# Patient Record
Sex: Male | Born: 1980 | Race: White | Hispanic: No | Marital: Married | Smoking: Former smoker
Health system: Southern US, Community
[De-identification: ages and names within clinical notes are randomized; demographics above are authoritative.]

## PROBLEM LIST (undated history)

## (undated) DIAGNOSIS — F419 Anxiety disorder, unspecified: Secondary | ICD-10-CM

## (undated) DIAGNOSIS — K219 Gastro-esophageal reflux disease without esophagitis: Secondary | ICD-10-CM

## (undated) DIAGNOSIS — I10 Essential (primary) hypertension: Secondary | ICD-10-CM

## (undated) DIAGNOSIS — F32A Depression, unspecified: Secondary | ICD-10-CM

## (undated) HISTORY — DX: Essential (primary) hypertension: I10

## (undated) HISTORY — DX: Gastro-esophageal reflux disease without esophagitis: K21.9

## (undated) HISTORY — DX: Depression, unspecified: F32.A

## (undated) HISTORY — DX: Anxiety disorder, unspecified: F41.9

---

## 2002-06-18 HISTORY — PX: UPPER GI ENDOSCOPY: SHX6162

## 2009-04-27 ENCOUNTER — Ambulatory Visit: Payer: Self-pay

## 2010-07-13 ENCOUNTER — Ambulatory Visit: Payer: Self-pay

## 2010-08-02 ENCOUNTER — Ambulatory Visit: Payer: Self-pay

## 2011-08-26 IMAGING — NM NM THYROID IMAGING W/ UPTAKE SINGLE (24 HR)
1 series · 3 of 3 positions shown · non-contrast
Comparison: none

REASON FOR EXAM: hyperthyroidism
COMMENTS:

[Series 1000: (id) thyroid scan · 2.40mm/px · 3 of 3 slices shown]
[im 1/3]
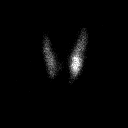
[im 2/3]
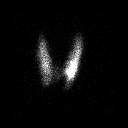
[im 3/3  full-range]
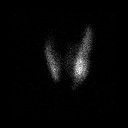

[3 of 3 positions shown; findings below may reference images not displayed]

PROCEDURE:     KNM - KNM THYROID 7-F94 24HR [DATE]  [DATE]

RESULT:     The patient received a dose of 159.6 uCi of 5odine-MCZ with
uptake measured at 6 and 24 hours to be 47.7 and 56.6% respectively. Images
demonstrate what appears to be a dominant nodule in the lower pole of the
left thyroid lobe with suppression of uptake in the remaining portions of
the gland. No photopenic areas are seen.
IMPRESSION: Probable dominant functioning nodule in the lower pole the
left lobe of the thyroid with findings consistent with hyperthyroidism.

## 2012-05-12 ENCOUNTER — Ambulatory Visit (INDEPENDENT_AMBULATORY_CARE_PROVIDER_SITE_OTHER): Payer: BC Managed Care – PPO | Admitting: Family Medicine

## 2012-05-12 VITALS — BP 128/83 | HR 95 | Temp 98.0°F | Resp 17 | Ht 75.0 in | Wt 203.0 lb

## 2012-05-12 DIAGNOSIS — L509 Urticaria, unspecified: Secondary | ICD-10-CM

## 2012-05-12 DIAGNOSIS — E039 Hypothyroidism, unspecified: Secondary | ICD-10-CM

## 2012-05-12 DIAGNOSIS — R509 Fever, unspecified: Secondary | ICD-10-CM

## 2012-05-12 DIAGNOSIS — J029 Acute pharyngitis, unspecified: Secondary | ICD-10-CM

## 2012-05-12 LAB — POCT CBC
HCT, POC: 52.1 % (ref 43.5–53.7)
Hemoglobin: 16.5 g/dL (ref 14.1–18.1)
Lymph, poc: 1.2 (ref 0.6–3.4)
MCH, POC: 29.9 pg (ref 27–31.2)
MCHC: 31.7 g/dL — AB (ref 31.8–35.4)
WBC: 7.1 10*3/uL (ref 4.6–10.2)

## 2012-05-12 LAB — POCT RAPID STREP A (OFFICE): Rapid Strep A Screen: NEGATIVE

## 2012-05-12 MED ORDER — DIPHENHYDRAMINE HCL 25 MG PO CAPS: 50.0000 mg | ORAL_CAPSULE | Freq: Once | ORAL | Status: DC

## 2012-05-12 NOTE — Progress Notes (Signed)
[facility] [address] [place] [address] [phone]  Date:  05/12/2012   Name:  Sean Dean   DOB:  1980-06-18   MRN:  [id number]  PCP:  No primary provider on file.    Chief Complaint: Fever, Chills, Sore Throat and Rash   History of Present Illness:  Sean Dean is a 31 y.o. very pleasant male patient who presents with the following:  About a week ago he noted onset of a few hives- however, these resolved after a day.  Yesterday around 10 pm the rash returned but was more widespread.  He has itchy hives over his extremities and trunk.  He also noted onset of chills last night and a temperature up to 100.7.  He has taken tylenol this morning. He also noted a ST yesterday- less painful now. It felt "like acid reflux."  He also has a mild cough. No runny or stuffy nose. No GI symptoms.    Generally healthy except for hypothyroidism.  His dosage was changed a couple of months ago- no other new medications or foods/ products.    He works as a Research officer, trade [employer]. Here today with his SO.  No known sick contacts but he does interact with a lot of people day to day.  He does not have any wounds, etc related to his work.    Never a smoker  There is no problem list on file for this patient.   Past Medical History  Diagnosis Date  . GERD (gastroesophageal reflux disease)     History reviewed. No pertinent past surgical history.  History  Substance Use Topics  . Smoking status: Never Smoker   . Smokeless tobacco: Not on file  . Alcohol Use: Not on file    History reviewed. No pertinent family history.  No Known Allergies  Medication list has been reviewed and updated.  Current Outpatient Prescriptions on File Prior to Visit  Medication Sig Dispense Refill  . levothyroxine (SYNTHROID, LEVOTHROID) 125 MCG tablet Take 125 mcg by mouth daily.        Review of Systems:  As per HPI- otherwise negative.   Physical Examination: Filed Vitals:   05/12/12 1141  BP: 128/83  Pulse: 95  Temp: 98 F (36.7 C)  Resp: 17   Filed Vitals:   05/12/12 1141  Height: 6\' 3"  (1.905 m)  Weight: 203 lb (92.08 kg)   Body mass index is 25.37 kg/(m^2). Ideal Body Weight: Weight in (lb) to have BMI = 25: 199.6   GEN: WDWN, NAD, Non-toxic, A & O x 3 HEENT: Atraumatic, Normocephalic. Neck supple. No masses, No LAD. Bilateral TM wnl, oropharynx normal.  PEERL,EOMI.   Ears and Nose: No external deformity. CV: RRR, No M/G/R. No JVD. No thrill. No extra heart sounds. PULM: CTA B, no wheezes, crackles, rhonchi. No retractions. No resp. distress. No accessory muscle use. ABD: S, NT, ND. No rebound. No HSM. EXTR: No c/c/e NEURO Normal gait.  PSYCH: Normally interactive. Conversant. Not depressed or anxious appearing.  Calm demeanor.  He has blanching hives over his body- mostly on his lower back and arms.  No petechial rash, not consistent with scarlet fever.    Results for orders placed in visit on 05/12/12  POCT CBC      Component Value Range   WBC 7.1  4.6 - 10.2 K/uL   Lymph, poc 1.2  0.6 - 3.4   POC LYMPH PERCENT 16.4  10 - 50 %L  MID (cbc) 0.6  0 - 0.9   POC MID % 8.2  0 - 12 %M   POC Granulocyte 5.4  2 - 6.9   Granulocyte percent 75.4  37 - 80 %G   RBC 5.51  4.69 - 6.13 M/uL   Hemoglobin 16.5  14.1 - 18.1 g/dL   HCT, POC 96.0  45.4 - 53.7 %   MCV 94.5  80 - 97 fL   MCH, POC 29.9  27 - 31.2 pg   MCHC 31.7 (*) 31.8 - 35.4 g/dL   RDW, POC 09.8     Platelet Count, POC 185  142 - 424 K/uL   MPV 11.2  0 - 99.8 fL  POCT INFLUENZA A/B      Component Value Range   Influenza A, POC Negative     Influenza B, POC Negative    POCT RAPID STREP A (OFFICE)      Component Value Range   Rapid Strep A Screen Negative  Negative   Given benadryl po 50 mg- this did help relieve his hives and itching Assessment and Plan: 1. Fever  POCT CBC, POCT Influenza A/B, POCT rapid strep A, Culture, Group A Strep  2. Hypothyroidism    3. Sore throat  POCT  rapid strep A, POCT rapid strep A, Culture, Group A Strep  4. Hives  diphenhydrAMINE (BENADRYL) capsule 50 mg   Sean Dean is here with a likely viral illness today.  He has non- specific symptoms of fever, chills, and a rash consistent with hives.  At this time will treat supportively with anti- pyretics and benadryl as needed for itching.  However, will follow- up closely if not getting better or if getting worse. If any severe symptoms such as AMS, severe HA, vomiting or high fever get help right away  [facility], MD

## 2012-05-12 NOTE — Patient Instructions (Addendum)
It seems that you have a viral illness.  Rest, drink plenty of fluids and use ibuprofen/ tylenol as needed.  If you are not better in the next 48 hours please let me know- Sooner if worse.

## 2012-05-14 LAB — CULTURE, GROUP A STREP: Organism ID, Bacteria: NORMAL

## 2012-05-29 ENCOUNTER — Other Ambulatory Visit: Payer: Self-pay | Admitting: Family Medicine

## 2012-05-29 LAB — CBC WITH DIFFERENTIAL/PLATELET
Eosinophil #: 0.1 10*3/uL (ref 0.0–0.7)
Eosinophil %: 1.7 %
HCT: 42.1 % (ref 40.0–52.0)
HGB: 14.4 g/dL (ref 13.0–18.0)
Lymphocyte #: 1.4 10*3/uL (ref 1.0–3.6)
Lymphocyte %: 19.6 %
MCH: 30.4 pg (ref 26.0–34.0)
MCV: 89 fL (ref 80–100)
Monocyte %: 5.7 %
Neutrophil #: 5 10*3/uL (ref 1.4–6.5)
Neutrophil %: 72.5 %

## 2012-05-29 LAB — COMPREHENSIVE METABOLIC PANEL
Bilirubin,Total: 0.7 mg/dL (ref 0.2–1.0)
Chloride: 106 mmol/L (ref 98–107)
Co2: 28 mmol/L (ref 21–32)
Creatinine: 1.09 mg/dL (ref 0.60–1.30)
EGFR (African American): >60
Glucose: 112 mg/dL — ABNORMAL HIGH (ref 65–99)
Potassium: 3.7 mmol/L (ref 3.5–5.1)
Total Protein: 7.9 g/dL (ref 6.4–8.2)

## 2012-10-01 DIAGNOSIS — L508 Other urticaria: Secondary | ICD-10-CM | POA: Insufficient documentation

## 2013-08-08 LAB — CBC AND DIFFERENTIAL
HEMATOCRIT: 43 % (ref 41–53)
HEMOGLOBIN: 14.7 g/dL (ref 13.5–17.5)
Neutrophils Absolute: 3 /uL
PLATELETS: 230 10*3/uL (ref 150–399)
WBC: 5.3 10^3/mL

## 2013-08-08 LAB — LIPID PANEL
Cholesterol: 182 mg/dL (ref 0–200)
HDL: 62 mg/dL (ref 35–70)
LDL Cholesterol: 105 mg/dL
TRIGLYCERIDES: 75 mg/dL (ref 40–160)

## 2013-08-08 LAB — BASIC METABOLIC PANEL
BUN: 13 mg/dL (ref 4–21)
Creatinine: 1 mg/dL (ref 0.6–1.3)
GLUCOSE: 70 mg/dL
POTASSIUM: 4.6 mmol/L (ref 3.4–5.3)
SODIUM: 142 mmol/L (ref 137–147)

## 2013-08-08 LAB — HEPATIC FUNCTION PANEL
ALT: 11 U/L (ref 10–40)
AST: 14 U/L (ref 14–40)
Alkaline Phosphatase: 64 U/L (ref 25–125)
BILIRUBIN, TOTAL: 0.8 mg/dL

## 2013-08-08 LAB — TSH: TSH: 1.31 u[IU]/mL (ref 0.41–5.90)

## 2014-12-09 DIAGNOSIS — E785 Hyperlipidemia, unspecified: Secondary | ICD-10-CM

## 2014-12-09 DIAGNOSIS — H53009 Unspecified amblyopia, unspecified eye: Secondary | ICD-10-CM | POA: Insufficient documentation

## 2014-12-09 DIAGNOSIS — D808 Other immunodeficiencies with predominantly antibody defects: Secondary | ICD-10-CM

## 2014-12-09 DIAGNOSIS — K219 Gastro-esophageal reflux disease without esophagitis: Secondary | ICD-10-CM | POA: Insufficient documentation

## 2014-12-09 DIAGNOSIS — F429 Obsessive-compulsive disorder, unspecified: Secondary | ICD-10-CM

## 2014-12-09 DIAGNOSIS — J309 Allergic rhinitis, unspecified: Secondary | ICD-10-CM | POA: Insufficient documentation

## 2014-12-09 DIAGNOSIS — K279 Peptic ulcer, site unspecified, unspecified as acute or chronic, without hemorrhage or perforation: Secondary | ICD-10-CM

## 2014-12-09 DIAGNOSIS — E559 Vitamin D deficiency, unspecified: Secondary | ICD-10-CM | POA: Insufficient documentation

## 2014-12-10 ENCOUNTER — Ambulatory Visit: Payer: Self-pay | Admitting: Family Medicine

## 2014-12-11 ENCOUNTER — Ambulatory Visit (INDEPENDENT_AMBULATORY_CARE_PROVIDER_SITE_OTHER): Payer: BLUE CROSS/BLUE SHIELD | Admitting: Family Medicine

## 2014-12-11 ENCOUNTER — Encounter: Payer: Self-pay | Admitting: Family Medicine

## 2014-12-11 VITALS — BP 114/72 | HR 64 | Temp 98.6°F | Resp 16 | Wt 227.0 lb

## 2014-12-11 DIAGNOSIS — E039 Hypothyroidism, unspecified: Secondary | ICD-10-CM | POA: Diagnosis not present

## 2014-12-11 DIAGNOSIS — K648 Other hemorrhoids: Secondary | ICD-10-CM

## 2014-12-11 DIAGNOSIS — T783XXA Angioneurotic edema, initial encounter: Secondary | ICD-10-CM | POA: Insufficient documentation

## 2014-12-11 DIAGNOSIS — M7052 Other bursitis of knee, left knee: Secondary | ICD-10-CM

## 2014-12-11 DIAGNOSIS — E7800 Pure hypercholesterolemia, unspecified: Secondary | ICD-10-CM | POA: Insufficient documentation

## 2014-12-11 DIAGNOSIS — E559 Vitamin D deficiency, unspecified: Secondary | ICD-10-CM | POA: Insufficient documentation

## 2014-12-11 MED ORDER — HYDROCORTISONE ACETATE 25 MG RE SUPP: 25.0000 mg | Freq: Every day | RECTAL | Status: DC

## 2014-12-11 MED ORDER — NAPROXEN 500 MG PO TABS: 500.0000 mg | ORAL_TABLET | Freq: Two times a day (BID) | ORAL | Status: DC

## 2014-12-11 NOTE — Progress Notes (Signed)
Patient ID: Sean Dean, male   DOB: December 24, 1980, 34 y.o.   MRN: [id number]    Subjective:  HPI Pt is here for a follow up of Hypothyroidism. Pt reports that he keeps gaining weight and exercises every day. He would like to have his levels checked to see if his meds need to be adjusted.   He also reports that he is having left leg pain. Occurs all the time. Worse at night and in the mornings. He reports that it has been going on for "quite a while". He reports it is a aching pain. He does sometimes have lower back pain and doe snot know if they could be related.   Prior to Admission medications   Medication Sig Start Date End Date Taking? Authorizing Provider  levothyroxine (SYNTHROID, LEVOTHROID) 125 MCG tablet Take 125 mcg by mouth daily.   Yes Historical Provider, MD    Patient Active Problem List   Diagnosis Date Noted  . Angio-edema 12/11/2014  . Adult hypothyroidism 12/11/2014  . Hypercholesterolemia without hypertriglyceridemia 12/11/2014  . Avitaminosis D 12/11/2014  . OCD (obsessive compulsive disorder) 12/09/2014  . Vitamin D deficiency 12/09/2014  . Hyperlipidemia 12/09/2014  . Lazy eye 12/09/2014  . Allergic rhinitis 12/09/2014  . GERD (gastroesophageal reflux disease) 12/09/2014  . PUD (peptic ulcer disease) 12/09/2014  . 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 Hypothyroidism 05/12/2012    Past Medical History  Diagnosis Date  . GERD (gastroesophageal reflux disease)     History   Social History  . Marital Status: Married    Spouse Name: N/A  . Number of Children: N/A  . Years of Education: N/A   Occupational History  . Not on file.   Social History Main Topics  . Smoking status: Former Games developer  . Smokeless tobacco: Not on file  . Alcohol Use: 12.6 oz/week    21 Cans of beer per week  . Drug Use: No  . Sexual Activity: Yes    Birth Control/ Protection: None   Other Topics Concern  . Not on file   Social History  Narrative    No Known Allergies  Review of Systems  Constitutional: Negative.   HENT: Negative.   Eyes: Negative.   Respiratory: Negative.   Cardiovascular: Negative.   Gastrointestinal: Negative.   Genitourinary: Negative.   Musculoskeletal: Positive for back pain and joint pain.  Skin: Negative.   Neurological: Negative.   Endo/Heme/Allergies: Negative.   Psychiatric/Behavioral: Negative.    visual exam of the perianal region reveals an internal hemorrhoid that is nonthrombosed and not actively bleeding.  Immunization History  Administered Date(s) Administered  . Tdap 03/31/2005   Objective:  BP 114/72 mmHg  Pulse 64  Temp(Src) 98.6 F (37 C) (Oral)  Resp 16  Wt 227 lb (102.967 kg)  Physical Exam  Constitutional: He is oriented to person, place, and time and well-developed, well-nourished, and in no distress.  HENT:  Head: Normocephalic and atraumatic.  Right Ear: External ear normal.  Left Ear: External ear normal.  Nose: Nose normal.  Eyes: Conjunctivae are normal.  Neck: Neck supple.  Cardiovascular: Normal rate, regular rhythm and normal heart sounds.   Pulmonary/Chest: Effort normal and breath sounds normal.  Abdominal: Soft. Bowel sounds are normal.  Visual exam of the perianal region reveals a nonbleeding, nonthrombosed internal hemorrhoid.  Musculoskeletal: Normal range of motion. He exhibits no edema or tenderness.  Minimal  tenderness along the medial joint line of the left knee. No effusion  or swelling or warmth.  Neurological: He is alert and oriented to person, place, and time. Gait normal.  Skin: Skin is warm and dry.  Psychiatric: Mood, memory, affect and judgment normal.    Lab Results  Component Value Date   WBC 5.3 08/08/2013   HGB 14.7 08/08/2013   HCT 43 08/08/2013   PLT 230 08/08/2013   GLUCOSE 112* 05/29/2012   CHOL 182 08/08/2013   TRIG 75 08/08/2013   HDL 62 08/08/2013   LDLCALC 105 08/08/2013   TSH 1.31 08/08/2013    CMP      Component Value Date/Time   NA 142 08/08/2013   NA 142 05/29/2012 1653   K 4.6 08/08/2013   K 3.7 05/29/2012 1653   CL 106 05/29/2012 1653   CO2 28 05/29/2012 1653   GLUCOSE 112* 05/29/2012 1653   BUN 13 08/08/2013   BUN 20* 05/29/2012 1653   CREATININE 1.0 08/08/2013   CREATININE 1.09 05/29/2012 1653   CALCIUM 9.1 05/29/2012 1653   PROT 7.9 05/29/2012 1653   ALBUMIN 4.6 05/29/2012 1653   AST 14 08/08/2013   AST 21 05/29/2012 1653   ALT 11 08/08/2013   ALT 29 05/29/2012 1653   ALKPHOS 64 08/08/2013   ALKPHOS 85 05/29/2012 1653   BILITOT 0.7 05/29/2012 1653   GFRNONAA >60 05/29/2012 1653   GFRAA >60 05/29/2012 1653    Assessment and Plan :  Knee pain Humble bursitis versus meniscal injury. Treatment with naproxen 500 mg twice a day when necessary. May need or the referral. Internal hemorrhoid Treat with Anusol HC suppository for 12 nights. Refer as needed. He has noticed some blood on the toilet paper. Julieanne Manson MD [facility] 12/11/2014 4:17 PM

## 2015-01-22 ENCOUNTER — Other Ambulatory Visit: Payer: Self-pay | Admitting: Family Medicine

## 2015-01-22 DIAGNOSIS — E039 Hypothyroidism, unspecified: Secondary | ICD-10-CM

## 2015-04-09 ENCOUNTER — Ambulatory Visit (INDEPENDENT_AMBULATORY_CARE_PROVIDER_SITE_OTHER): Payer: BLUE CROSS/BLUE SHIELD | Admitting: Family Medicine

## 2015-04-09 ENCOUNTER — Encounter: Payer: Self-pay | Admitting: Family Medicine

## 2015-04-09 VITALS — BP 132/74 | HR 60 | Temp 97.9°F | Resp 16 | Wt 222.0 lb

## 2015-04-09 DIAGNOSIS — N400 Enlarged prostate without lower urinary tract symptoms: Secondary | ICD-10-CM

## 2015-04-09 DIAGNOSIS — M7052 Other bursitis of knee, left knee: Secondary | ICD-10-CM

## 2015-04-09 DIAGNOSIS — E038 Other specified hypothyroidism: Secondary | ICD-10-CM | POA: Diagnosis not present

## 2015-04-09 DIAGNOSIS — M25562 Pain in left knee: Secondary | ICD-10-CM | POA: Diagnosis not present

## 2015-04-09 MED ORDER — TAMSULOSIN HCL 0.4 MG PO CAPS: 0.4000 mg | ORAL_CAPSULE | Freq: Every day | ORAL | Status: DC

## 2015-04-09 MED ORDER — NAPROXEN 500 MG PO TABS: 500.0000 mg | ORAL_TABLET | Freq: Two times a day (BID) | ORAL | Status: DC

## 2015-04-09 NOTE — Progress Notes (Signed)
Patient ID: Sean Dean, male   DOB: [birth year], 34 y.o.   MRN: [id number]    Subjective:  HPI Pt has multiple mild issues/concerns today. Nocturia: Patient states for 1 month he had nocturia-about 4 times a night he uses the bathroom. Sometimes he has little output but he has urgency to use the bathroom. Not really bothering him during the day, denies any other urinary symptoms. He does snore if he lays on his back, does not stop breathing that he knows of.  Prior to Admission medications   Medication Sig Start Date End Date Taking? Authorizing Provider  hydrocortisone (ANUSOL-HC) 25 MG suppository Place 1 suppository (25 mg total) rectally at bedtime. PRN 12/11/14  Yes Richard Hulen Shouts., MD  levothyroxine (SYNTHROID, LEVOTHROID) 125 MCG tablet TAKE 1 TABLET BY MOUTH EVERY DAY 01/22/15  Yes Maple Hudson., MD  naproxen (NAPROSYN) 500 MG tablet Take 1 tablet (500 mg total) by mouth 2 (two) times daily with a meal. 12/11/14  Yes Maple Hudson., MD    Patient Active Problem List   Diagnosis Date Noted  . Angio-edema 12/11/2014  . Adult hypothyroidism 12/11/2014  . Hypercholesterolemia without hypertriglyceridemia 12/11/2014  . Avitaminosis D 12/11/2014  . OCD (obsessive compulsive disorder) 12/09/2014  . Vitamin D deficiency 12/09/2014  . Hyperlipidemia 12/09/2014  . Lazy eye 12/09/2014  . Allergic rhinitis 12/09/2014  . GERD (gastroesophageal reflux disease) 12/09/2014  . PUD (peptic ulcer disease) 12/09/2014  . 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 Hypothyroidism 05/12/2012    Past Medical History  Diagnosis Date  . GERD (gastroesophageal reflux disease)     Social History   Social History  . Marital Status: Married    Spouse Name: N/A  . Number of Children: N/A  . Years of Education: N/A   Occupational History  . Not on file.   Social History Main Topics  . Smoking status: Former Games developer  . Smokeless tobacco: Never  Used  . Alcohol Use: 12.6 oz/week    21 Cans of beer per week  . Drug Use: No  . Sexual Activity: Yes    Birth Control/ Protection: None   Other Topics Concern  . Not on file   Social History Narrative    No Known Allergies  Review of Systems  Constitutional: Negative.   Respiratory: Negative.   Cardiovascular: Negative.   Gastrointestinal: Negative.   Genitourinary: Positive for urgency and frequency.  Neurological: Negative.   Endo/Heme/Allergies: Negative.   Psychiatric/Behavioral: Negative.     Immunization History  Administered Date(s) Administered  . Tdap 03/31/2005   Objective:  BP 132/74 mmHg  Pulse 60  Temp(Src) 97.9 F (36.6 C)  Resp 16  Wt 222 lb (100.699 kg)  Physical Exam  Constitutional: He is oriented to person, place, and time and well-developed, well-nourished, and in no distress.  HENT:  Head: Normocephalic and atraumatic.  Right Ear: External ear normal.  Left Ear: External ear normal.  Nose: Nose normal.  Eyes: Conjunctivae are normal.  Neck: Neck supple.  Cardiovascular: Normal rate, regular rhythm and normal heart sounds.   Pulmonary/Chest: Effort normal and breath sounds normal.  Abdominal: Soft.  Neurological: He is alert and oriented to person, place, and time.  Skin: Skin is warm and dry.  Psychiatric: Mood, memory, affect and judgment normal.    Lab Results  Component Value Date   WBC 5.3 08/08/2013   HGB 14.7 08/08/2013   HCT 43 08/08/2013   PLT  230 08/08/2013   GLUCOSE 112* 05/29/2012   CHOL 182 08/08/2013   TRIG 75 08/08/2013   HDL 62 08/08/2013   LDLCALC 105 08/08/2013   TSH 1.31 08/08/2013    CMP     Component Value Date/Time   NA 142 08/08/2013   NA 142 05/29/2012 1653   K 4.6 08/08/2013   K 3.7 05/29/2012 1653   CL 106 05/29/2012 1653   CO2 28 05/29/2012 1653   GLUCOSE 112* 05/29/2012 1653   BUN 13 08/08/2013   BUN 20* 05/29/2012 1653   CREATININE 1.0 08/08/2013   CREATININE 1.09 05/29/2012 1653    CALCIUM 9.1 05/29/2012 1653   PROT 7.9 05/29/2012 1653   ALBUMIN 4.6 05/29/2012 1653   AST 14 08/08/2013   AST 21 05/29/2012 1653   ALT 11 08/08/2013   ALT 29 05/29/2012 1653   ALKPHOS 64 08/08/2013   ALKPHOS 85 05/29/2012 1653   BILITOT 0.7 05/29/2012 1653   GFRNONAA >60 05/29/2012 1653   GFRAA >60 05/29/2012 1653    Assessment and Plan :  1. Left knee pain Possible bursitis. - Ambulatory referral to Orthopedic Surgery  2. BPH (benign prostatic hypertrophy)/OAB symptoms New. UA was normal today. Epworth was 16. Will follow and treat as BPH at this time - tamsulosin (FLOMAX) 0.4 MG CAPS capsule; Take 1 capsule (0.4 mg total) by mouth daily.  Dispense: 30 capsule; Refill: 5 - POCT urinalysis dipstick  3. Other specified hypothyroidism  - TSH  4. Bursitis, knee, left  - naproxen (NAPROSYN) 500 MG tablet; Take 1 tablet (500 mg total) by mouth 2 (two) times daily with a meal.  Dispense: 60 tablet; Refill: 5 5. Possible OSA Epworth 16. Pt declines sleep study.  Julieanne Mansonichard Gilbert MD [facility] 04/09/2015 11:23 AM

## 2015-04-10 LAB — TSH: TSH: 2.96 u[IU]/mL (ref 0.450–4.500)

## 2015-04-13 NOTE — Progress Notes (Signed)
Advised  ED 

## 2015-05-01 ENCOUNTER — Telehealth: Payer: Self-pay

## 2015-05-01 DIAGNOSIS — E039 Hypothyroidism, unspecified: Secondary | ICD-10-CM

## 2015-05-01 MED ORDER — LEVOTHYROXINE SODIUM 125 MCG PO TABS: 125.0000 ug | ORAL_TABLET | Freq: Every day | ORAL | Status: DC

## 2015-05-01 NOTE — Telephone Encounter (Signed)
Refill request from pharmacy[facility] 

## 2015-05-13 ENCOUNTER — Encounter: Payer: Self-pay | Admitting: Family Medicine

## 2015-05-13 ENCOUNTER — Ambulatory Visit (INDEPENDENT_AMBULATORY_CARE_PROVIDER_SITE_OTHER): Payer: BLUE CROSS/BLUE SHIELD | Admitting: Family Medicine

## 2015-05-13 VITALS — BP 128/72 | HR 64 | Temp 97.4°F | Resp 16 | Ht 76.0 in | Wt 222.0 lb

## 2015-05-13 DIAGNOSIS — E039 Hypothyroidism, unspecified: Secondary | ICD-10-CM

## 2015-05-13 DIAGNOSIS — Z0000 Encounter for general adult medical examination without abnormal findings: Secondary | ICD-10-CM | POA: Diagnosis not present

## 2015-05-13 DIAGNOSIS — E7800 Pure hypercholesterolemia, unspecified: Secondary | ICD-10-CM

## 2015-05-13 DIAGNOSIS — Z23 Encounter for immunization: Secondary | ICD-10-CM

## 2015-05-13 LAB — POCT URINALYSIS DIPSTICK
Bilirubin, UA: NEGATIVE
Glucose, UA: NEGATIVE
Ketones, UA: NEGATIVE
LEUKOCYTES UA: NEGATIVE
NITRITE UA: NEGATIVE
PH UA: 6
Protein, UA: NEGATIVE
RBC UA: NEGATIVE
Spec Grav, UA: 1.025
Urobilinogen, UA: 0.2

## 2015-05-13 NOTE — Progress Notes (Signed)
Patient ID: Sean Dean, male   DOB: 10/01/1980, 34 y.o.   MRN: [id number]       Patient: Sean Dean, Male    DOB: 10/01/1980, 34 y.o.   MRN: [id number] Visit Date: 05/13/2015  Today's Provider: Megan Mansichard Sanchez Hemmer Jr, MD   Chief Complaint  Patient presents with  . Annual Exam   Subjective:    Annual physical exam Sean Dean is a 34 y.o. male who presents today for health maintenance and complete physical. He feels well. He reports exercising 3 days a week. He reports he is sleeping well. He sleeps on average 6-8 hours.   Last:  Tdap- 03/31/2005  EKG- 06/10/2010  Endoscopy- 06/18/2002    -----------------------------------------------------------------   Review of Systems  Constitutional: Negative.   HENT: Negative.   Eyes: Negative.   Respiratory: Negative.   Cardiovascular: Negative.   Gastrointestinal: Negative.   Endocrine: Negative.   Genitourinary: Negative.   Musculoskeletal: Negative.   Skin: Negative.   Allergic/Immunologic: Negative.   Neurological: Negative.   Hematological: Negative.   Psychiatric/Behavioral: Negative.     Social History      He  reports that he has quit smoking. He has never used smokeless tobacco. He reports that he drinks about 12.6 oz of alcohol per week. He reports that he does not use illicit drugs.       Social History   Social History  . Marital Status: Married    Spouse Name: N/A  . Number of Children: N/A  . Years of Education: N/A   Social History Main Topics  . Smoking status: Former Games developermoker  . Smokeless tobacco: Never Used  . Alcohol Use: 12.6 oz/week    21 Cans of beer per week  . Drug Use: No  . Sexual Activity: Yes    Birth Control/ Protection: None   Other Topics Concern  . None   Social History Narrative    Patient Active Problem List   Diagnosis Date Noted  . Angio-edema 12/11/2014  . Adult hypothyroidism 12/11/2014  . Hypercholesterolemia without hypertriglyceridemia 12/11/2014  .  Avitaminosis D 12/11/2014  . OCD (obsessive compulsive disorder) 12/09/2014  . Vitamin D deficiency 12/09/2014  . Hyperlipidemia 12/09/2014  . Lazy eye 12/09/2014  . Allergic rhinitis 12/09/2014  . GERD (gastroesophageal reflux disease) 12/09/2014  . PUD (peptic ulcer disease) 12/09/2014  . 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 Hypothyroidism 05/12/2012    Past Surgical History  Procedure Laterality Date  . Upper gi endoscopy  06/18/02    normal everything    Family History        Family Status  Relation Status Death Age  . Sister Alive   . Mother Alive   . Father Alive   . Maternal Grandmother Deceased         His family history includes Colon cancer in his maternal grandfather; Epilepsy in his sister; Healthy in his father and mother; Prostate cancer in his paternal grandfather.    No Known Allergies  Previous Medications   LEVOTHYROXINE (SYNTHROID, LEVOTHROID) 125 MCG TABLET    Take 1 tablet (125 mcg total) by mouth daily.   NAPROXEN (NAPROSYN) 500 MG TABLET    Take 1 tablet (500 mg total) by mouth 2 (two) times daily with a meal.   TAMSULOSIN (FLOMAX) 0.4 MG CAPS CAPSULE    Take 1 capsule (0.4 mg total) by mouth daily.    Patient Care Team: Maple Hudsonichard L Eshan Trupiano Jr., MD as PCP -  General (Family Medicine)     Objective:   Vitals: BP 128/72 mmHg  Pulse 64  Temp(Src) 97.4 F (36.3 C)  Resp 16  Ht  (1.93 m)  Wt 222 lb (100.699 kg)  BMI 27.03 kg/m2   Physical Exam  Constitutional: He is oriented to person, place, and time. He appears well-developed and well-nourished.  HENT:  Head: Normocephalic.  Right Ear: External ear normal.  Left Ear: External ear normal.  Nose: Nose normal.  Mouth/Throat: Oropharynx is clear and moist.  Eyes: Conjunctivae and EOM are normal. Pupils are equal, round, and reactive to light.  Neck: Neck supple.  Cardiovascular: Normal rate, regular rhythm, normal heart sounds and intact distal pulses.     Pulmonary/Chest: Effort normal and breath sounds normal.  Abdominal: Soft.  Genitourinary: Penis normal.  Musculoskeletal: Normal range of motion.  Neurological: He is alert and oriented to person, place, and time.  Skin: Skin is warm and dry.  Psychiatric: He has a normal mood and affect. His behavior is normal. Judgment and thought content normal.     Assessment & Plan:     Routine Health Maintenance and Physical Exam  Exercise Activities and Dietary recommendations Goals    None      Immunization History  Administered Date(s) Administered  . Tdap 03/31/2005    Health Maintenance  Topic Date Due  . HIV Screening  12/13/1994  . TETANUS/TDAP  04/01/2015  . INFLUENZA VACCINE  04/08/2016 (Originally 12/22/2014)      Discussed health benefits of physical activity, and encouraged him to engage in regular exercise appropriate for his age and condition.   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 I have done the exam and reviewed the above chart and it is accurate to the best of my knowledge.  --------------------------------------------------------------------

## 2015-05-14 LAB — TSH: TSH: 3.39 u[IU]/mL (ref 0.450–4.500)

## 2015-05-14 LAB — COMPREHENSIVE METABOLIC PANEL
ALK PHOS: 59 IU/L (ref 39–117)
ALT: 16 IU/L (ref 0–44)
AST: 17 IU/L (ref 0–40)
Albumin/Globulin Ratio: 2 (ref 1.1–2.5)
Albumin: 4.5 g/dL (ref 3.5–5.5)
BUN/Creatinine Ratio: 19 (ref 8–19)
BUN: 16 mg/dL (ref 6–20)
Bilirubin Total: 0.6 mg/dL (ref 0.0–1.2)
CO2: 26 mmol/L (ref 18–29)
Calcium: 10.1 mg/dL (ref 8.7–10.2)
Chloride: 98 mmol/L (ref 96–106)
Creatinine, Ser: 0.86 mg/dL (ref 0.76–1.27)
GFR calc Af Amer: 130 mL/min/{1.73_m2} (ref >59)
GFR calc non Af Amer: 112 mL/min/{1.73_m2} (ref >59)
GLOBULIN, TOTAL: 2.3 g/dL (ref 1.5–4.5)
Glucose: 94 mg/dL (ref 65–99)
Potassium: 4.2 mmol/L (ref 3.5–5.2)
SODIUM: 139 mmol/L (ref 134–144)
Total Protein: 6.8 g/dL (ref 6.0–8.5)

## 2015-05-14 LAB — LIPID PANEL
CHOLESTEROL TOTAL: 203 mg/dL — AB (ref 100–199)
Chol/HDL Ratio: 2.6 ratio units (ref 0.0–5.0)
HDL: 77 mg/dL (ref >39)
LDL Calculated: 116 mg/dL — ABNORMAL HIGH (ref 0–99)
Triglycerides: 51 mg/dL (ref 0–149)
VLDL CHOLESTEROL CAL: 10 mg/dL (ref 5–40)

## 2015-05-14 LAB — CBC WITH DIFFERENTIAL/PLATELET
BASOS ABS: 0 10*3/uL (ref 0.0–0.2)
Basos: 0 %
EOS (ABSOLUTE): 0.1 10*3/uL (ref 0.0–0.4)
Eos: 2 %
Hematocrit: 42.4 % (ref 37.5–51.0)
Hemoglobin: 14.6 g/dL (ref 12.6–17.7)
Immature Grans (Abs): 0 10*3/uL (ref 0.0–0.1)
Immature Granulocytes: 0 %
LYMPHS ABS: 1.4 10*3/uL (ref 0.7–3.1)
Lymphs: 23 %
MCH: 31.2 pg (ref 26.6–33.0)
MCHC: 34.4 g/dL (ref 31.5–35.7)
MCV: 91 fL (ref 79–97)
Monocytes Absolute: 0.5 10*3/uL (ref 0.1–0.9)
Monocytes: 8 %
Neutrophils Absolute: 4.3 10*3/uL (ref 1.4–7.0)
Neutrophils: 67 %
Platelets: 203 10*3/uL (ref 150–379)
RBC: 4.68 x10E6/uL (ref 4.14–5.80)
RDW: 12.9 % (ref 12.3–15.4)
WBC: 6.3 10*3/uL (ref 3.4–10.8)

## 2015-11-05 ENCOUNTER — Encounter: Payer: Self-pay | Admitting: Family Medicine

## 2015-11-05 ENCOUNTER — Ambulatory Visit
Admission: RE | Admit: 2015-11-05 | Discharge: 2015-11-05 | Disposition: A | Discharge status: Home / Self Care | Payer: BLUE CROSS/BLUE SHIELD | Source: Ambulatory Visit | Attending: Family Medicine | Admitting: Family Medicine

## 2015-11-05 ENCOUNTER — Ambulatory Visit (INDEPENDENT_AMBULATORY_CARE_PROVIDER_SITE_OTHER): Payer: BLUE CROSS/BLUE SHIELD | Admitting: Family Medicine

## 2015-11-05 VITALS — BP 124/68 | HR 62 | Temp 97.4°F | Resp 16 | Wt 220.0 lb

## 2015-11-05 DIAGNOSIS — M79605 Pain in left leg: Secondary | ICD-10-CM

## 2015-11-05 DIAGNOSIS — M419 Scoliosis, unspecified: Secondary | ICD-10-CM | POA: Diagnosis not present

## 2015-11-05 DIAGNOSIS — K219 Gastro-esophageal reflux disease without esophagitis: Secondary | ICD-10-CM | POA: Diagnosis not present

## 2015-11-05 DIAGNOSIS — R1032 Left lower quadrant pain: Secondary | ICD-10-CM

## 2015-11-05 DIAGNOSIS — M545 Low back pain: Secondary | ICD-10-CM | POA: Diagnosis not present

## 2015-11-05 DIAGNOSIS — M25552 Pain in left hip: Secondary | ICD-10-CM | POA: Diagnosis not present

## 2015-11-05 MED ORDER — PREDNISONE 10 MG (48) PO TBPK: ORAL_TABLET | Freq: Every day | ORAL | Status: DC

## 2015-11-05 MED ORDER — OMEPRAZOLE 20 MG PO CPDR: 20.0000 mg | DELAYED_RELEASE_CAPSULE | Freq: Every day | ORAL | Status: DC

## 2015-11-05 NOTE — Progress Notes (Signed)
Patient ID: Sean Dean, male   DOB: 1980-07-30, 35 y.o.   MRN: [id number]    Subjective:  HPI Pt is here for left leg pain. From the groin down to the knee. He reports that it has been going on for a couple of years. He reports that the pain is like a dull achy pain. Pt reports that it is a constant pain, not made worse by anything and taking Naproxen and icy hot spray helps. The pain is worse at night when he lays in bed. He reports that he was seen for this a while back and was referred to PT but at the time he was having issues with insurance and could not go. No x-rays have been done in the past for this.   Prior to Admission medications   Medication Sig Start Date End Date Taking? Authorizing Provider  levothyroxine (SYNTHROID, LEVOTHROID) 125 MCG tablet Take 1 tablet (125 mcg total) by mouth daily. 05/01/15  Yes Richard Hulen ShoutsL Gilbert Jr., MD  naproxen (NAPROSYN) 500 MG tablet Take 1 tablet (500 mg total) by mouth 2 (two) times daily with a meal. 04/09/15  Yes Maple Hudsonichard L Gilbert Jr., MD  tamsulosin (FLOMAX) 0.4 MG CAPS capsule Take 1 capsule (0.4 mg total) by mouth daily. Patient not taking: Reported on 05/13/2015 04/09/15   Maple Hudsonichard L Gilbert Jr., MD    Patient Active Problem List   Diagnosis Date Noted  . Angio-edema 12/11/2014  . Adult hypothyroidism 12/11/2014  . Hypercholesterolemia without hypertriglyceridemia 12/11/2014  . Avitaminosis D 12/11/2014  . OCD (obsessive compulsive disorder) 12/09/2014  . Vitamin D deficiency 12/09/2014  . Hyperlipidemia 12/09/2014  . Lazy eye 12/09/2014  . Allergic rhinitis 12/09/2014  . GERD (gastroesophageal reflux disease) 12/09/2014  . PUD (peptic ulcer disease) 12/09/2014  . 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 Hypothyroidism 05/12/2012    Past Medical History  Diagnosis Date  . GERD (gastroesophageal reflux disease)     Social History   Social History  . Marital Status: Married    Spouse Name:  N/A  . Number of Children: N/A  . Years of Education: N/A   Occupational History  . Not on file.   Social History Main Topics  . Smoking status: Former Games developermoker  . Smokeless tobacco: Never Used  . Alcohol Use: 12.6 oz/week    21 Cans of beer per week     Comment: couple drinks every other day  . Drug Use: No  . Sexual Activity: Yes    Birth Control/ Protection: None   Other Topics Concern  . Not on file   Social History Narrative    No Known Allergies  Review of Systems  Constitutional: Negative.   HENT: Negative.   Eyes: Negative.   Respiratory: Negative.   Cardiovascular: Negative.   Gastrointestinal: Positive for heartburn.  Genitourinary: Negative.   Musculoskeletal: Negative.        Left leg pain from groin to knee  Skin: Negative.   Neurological: Negative.   Endo/Heme/Allergies: Negative.   Psychiatric/Behavioral: Negative.     Immunization History  Administered Date(s) Administered  . Influenza,inj,Quad PF,36+ Mos 05/13/2015  . Tdap 03/31/2005, 05/06/2011, 05/13/2015   Objective:  BP 124/68 mmHg  Pulse 62  Temp(Src) 97.4 F (36.3 C) (Oral)  Resp 16  Wt 220 lb (99.791 kg)  Physical Exam  Constitutional: He is well-developed, well-nourished, and in no distress.  Eyes: Conjunctivae and EOM are normal. Pupils are equal, round, and reactive to light.  Neck: Normal range of motion. Neck supple.  Cardiovascular: Normal rate, regular rhythm, normal heart sounds and intact distal pulses.   Pulmonary/Chest: Effort normal and breath sounds normal.  Abdominal: Soft. Bowel sounds are normal.  Musculoskeletal:  Positive straight leg raise on left leg. Figure 4 negative.   Skin: Skin is warm and dry.  Psychiatric: Mood, memory, affect and judgment normal.  Right 5th toe normal.  Lab Results  Component Value Date   WBC 6.3 05/13/2015   HGB 14.7 08/08/2013   HCT 42.4 05/13/2015   PLT 203 05/13/2015   GLUCOSE 94 05/13/2015   CHOL 203* 05/13/2015   TRIG 51  05/13/2015   HDL 77 05/13/2015   LDLCALC 116* 05/13/2015   TSH 3.390 05/13/2015    CMP     Component Value Date/Time   NA 139 05/13/2015 1203   NA 142 05/29/2012 1653   K 4.2 05/13/2015 1203   K 3.7 05/29/2012 1653   CL 98 05/13/2015 1203   CL 106 05/29/2012 1653   CO2 26 05/13/2015 1203   CO2 28 05/29/2012 1653   GLUCOSE 94 05/13/2015 1203   GLUCOSE 112* 05/29/2012 1653   BUN 16 05/13/2015 1203   BUN 20* 05/29/2012 1653   CREATININE 0.86 05/13/2015 1203   CREATININE 1.0 08/08/2013   CREATININE 1.09 05/29/2012 1653   CALCIUM 10.1 05/13/2015 1203   CALCIUM 9.1 05/29/2012 1653   PROT 6.8 05/13/2015 1203   PROT 7.9 05/29/2012 1653   ALBUMIN 4.5 05/13/2015 1203   ALBUMIN 4.6 05/29/2012 1653   AST 17 05/13/2015 1203   AST 21 05/29/2012 1653   ALT 16 05/13/2015 1203   ALT 29 05/29/2012 1653   ALKPHOS 59 05/13/2015 1203   ALKPHOS 85 05/29/2012 1653   BILITOT 0.6 05/13/2015 1203   BILITOT 0.7 05/29/2012 1653   GFRNONAA 112 05/13/2015 1203   GFRNONAA >60 05/29/2012 1653   GFRAA 130 05/13/2015 1203   GFRAA >60 05/29/2012 1653    Assessment and Plan :  1. Pain of left lower extremity Possible LS radiculopathy/spinal stenosis.May need MRI or referral. - DG Lumbar Spine Complete; Future - DG HIP UNILAT WITH PELVIS 2-3 VIEWS LEFT; Future - predniSONE (STERAPRED UNI-PAK 48 TAB) 10 MG (48) TBPK tablet; Take by mouth daily. Prednisone taper as directed.  Dispense: 48 tablet; Refill: 0  2. Groin pain, left  - DG Lumbar Spine Complete; Future - DG HIP UNILAT WITH PELVIS 2-3 VIEWS LEFT; Future - predniSONE (STERAPRED UNI-PAK 48 TAB) 10 MG (48) TBPK tablet; Take by mouth daily. Prednisone taper as directed.  Dispense: 48 tablet; Refill: 0  3. Gastroesophageal reflux disease, esophagitis presence not specified  - omeprazole (PRILOSEC) 20 MG capsule; Take 1 capsule (20 mg total) by mouth daily.  Dispense: 30 capsule; Refill: 12  Follow up in 2-3 weeks.  4.Right 5th toe  trauma Resolving.Exam WNL. Patient was seen and examined by Dr. Julieanne Manson, and noted scribed by Dimas Chyle, CMA I have done the exam and reviewed the above chart and it is accurate to the best of my knowledge.  Julieanne Manson MD [facility] 11/05/2015 8:19 AM

## 2015-11-26 ENCOUNTER — Ambulatory Visit (INDEPENDENT_AMBULATORY_CARE_PROVIDER_SITE_OTHER): Payer: BLUE CROSS/BLUE SHIELD | Admitting: Family Medicine

## 2015-11-26 ENCOUNTER — Encounter: Payer: Self-pay | Admitting: Family Medicine

## 2015-11-26 VITALS — BP 116/70 | HR 72 | Temp 98.6°F | Resp 16 | Wt 223.0 lb

## 2015-11-26 DIAGNOSIS — K219 Gastro-esophageal reflux disease without esophagitis: Secondary | ICD-10-CM | POA: Diagnosis not present

## 2015-11-26 DIAGNOSIS — R1032 Left lower quadrant pain: Secondary | ICD-10-CM | POA: Diagnosis not present

## 2015-11-26 DIAGNOSIS — M79605 Pain in left leg: Secondary | ICD-10-CM | POA: Diagnosis not present

## 2015-11-26 NOTE — Patient Instructions (Signed)
Will refer to Dr. Juanell FairlyKevin Krasinski (ortho) if hip not better or worse, Just call.

## 2015-11-26 NOTE — Progress Notes (Signed)
Patient ID: Sean Dean, male   DOB: Dec 28, 1980, 35 y.o.   MRN: [id number]    Subjective:  HPI GERD- pt is here for a follow up of GERD. Last office visit was 2-3 weeks ago. He was having heartburn and Omeprazole was started. He reports that it is much better since starting the medication. He is taking the Omeprazole daily with side effects.  Groin pain- Hip X-rays showed arthritic changes. Pt reports that since having the prednisone the hip is better as well.   Prior to Admission medications   Medication Sig Start Date End Date Taking? Authorizing Provider  levothyroxine (SYNTHROID, LEVOTHROID) 125 MCG tablet Take 1 tablet (125 mcg total) by mouth daily. 05/01/15  Yes Bhavya Grand Hulen ShoutsL Madelein Mahadeo Jr., MD  naproxen (NAPROSYN) 500 MG tablet Take 1 tablet (500 mg total) by mouth 2 (two) times daily with a meal. 04/09/15  Yes Maple Hudsonichard L Buford Bremer Jr., MD  omeprazole (PRILOSEC) 20 MG capsule Take 1 capsule (20 mg total) by mouth daily. 11/05/15   Jacksen Isip Hulen ShoutsL Teletha Petrea Jr., MD  predniSONE (STERAPRED UNI-PAK 48 TAB) 10 MG (48) TBPK tablet Take by mouth daily. Prednisone taper as directed. Patient not taking: Reported on 11/26/2015 11/05/15   Maple Hudsonichard L Ashanta Amoroso Jr., MD  tamsulosin (FLOMAX) 0.4 MG CAPS capsule Take 1 capsule (0.4 mg total) by mouth daily. Patient not taking: Reported on 05/13/2015 04/09/15   Maple Hudsonichard L Shiana Rappleye Jr., MD    Patient Active Problem List   Diagnosis Date Noted  . Angio-edema 12/11/2014  . Adult hypothyroidism 12/11/2014  . Hypercholesterolemia without hypertriglyceridemia 12/11/2014  . Avitaminosis D 12/11/2014  . OCD (obsessive compulsive disorder) 12/09/2014  . Vitamin D deficiency 12/09/2014  . Hyperlipidemia 12/09/2014  . Lazy eye 12/09/2014  . Allergic rhinitis 12/09/2014  . GERD (gastroesophageal reflux disease) 12/09/2014  . PUD (peptic ulcer disease) 12/09/2014  . 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 Hypothyroidism 05/12/2012    Past  Medical History  Diagnosis Date  . GERD (gastroesophageal reflux disease)     Social History   Social History  . Marital Status: Married    Spouse Name: N/A  . Number of Children: N/A  . Years of Education: N/A   Occupational History  . Not on file.   Social History Main Topics  . Smoking status: Former Games developermoker  . Smokeless tobacco: Never Used  . Alcohol Use: 12.6 oz/week    21 Cans of beer per week     Comment: couple drinks every other day  . Drug Use: No  . Sexual Activity: Yes    Birth Control/ Protection: None   Other Topics Concern  . Not on file   Social History Narrative    No Known Allergies  Review of Systems  Constitutional: Negative.   HENT: Negative.   Eyes: Negative.   Respiratory: Negative.   Cardiovascular: Negative.   Gastrointestinal: Negative.   Genitourinary: Negative.   Musculoskeletal: Negative.   Skin: Negative.   Neurological: Negative.   Endo/Heme/Allergies: Negative.   Psychiatric/Behavioral: Negative.     Immunization History  Administered Date(s) Administered  . Influenza,inj,Quad PF,36+ Mos 05/13/2015  . Tdap 03/31/2005, 05/06/2011, 05/13/2015   Objective:  BP 116/70 mmHg  Pulse 72  Temp(Src) 98.6 F (37 C) (Oral)  Resp 16  Wt 223 lb (101.152 kg)  Physical Exam  Constitutional: He is oriented to person, place, and time and well-developed, well-nourished, and in no distress.  Cardiovascular: Normal rate, regular rhythm, normal heart sounds and  intact distal pulses.   Musculoskeletal: Normal range of motion.  Neurological: He is alert and oriented to person, place, and time. He has normal reflexes. Gait normal. GCS score is 15.  Skin: Skin is warm and dry.  Psychiatric: Mood, memory, affect and judgment normal.  Mouth discomfort with figure-of-four maneuver on the left.  Lab Results  Component Value Date   WBC 6.3 05/13/2015   HGB 14.7 08/08/2013   HCT 42.4 05/13/2015   PLT 203 05/13/2015   GLUCOSE 94 05/13/2015    CHOL 203* 05/13/2015   TRIG 51 05/13/2015   HDL 77 05/13/2015   LDLCALC 116* 05/13/2015   TSH 3.390 05/13/2015    CMP     Component Value Date/Time   NA 139 05/13/2015 1203   NA 142 05/29/2012 1653   K 4.2 05/13/2015 1203   K 3.7 05/29/2012 1653   CL 98 05/13/2015 1203   CL 106 05/29/2012 1653   CO2 26 05/13/2015 1203   CO2 28 05/29/2012 1653   GLUCOSE 94 05/13/2015 1203   GLUCOSE 112* 05/29/2012 1653   BUN 16 05/13/2015 1203   BUN 20* 05/29/2012 1653   CREATININE 0.86 05/13/2015 1203   CREATININE 1.0 08/08/2013   CREATININE 1.09 05/29/2012 1653   CALCIUM 10.1 05/13/2015 1203   CALCIUM 9.1 05/29/2012 1653   PROT 6.8 05/13/2015 1203   PROT 7.9 05/29/2012 1653   ALBUMIN 4.5 05/13/2015 1203   ALBUMIN 4.6 05/29/2012 1653   AST 17 05/13/2015 1203   AST 21 05/29/2012 1653   ALT 16 05/13/2015 1203   ALT 29 05/29/2012 1653   ALKPHOS 59 05/13/2015 1203   ALKPHOS 85 05/29/2012 1653   BILITOT 0.6 05/13/2015 1203   BILITOT 0.7 05/29/2012 1653   GFRNONAA 112 05/13/2015 1203   GFRNONAA >60 05/29/2012 1653   GFRAA 130 05/13/2015 1203   GFRAA >60 05/29/2012 1653    Assessment and Plan :  1. Gastroesophageal reflux disease, esophagitis presence not specified Improved. Continue when necessary omeprazole.  2. Groin pain, left  Improved. Will refer to Dr. Martha ClanKrasinski if no better or worse, pt will call.  This appears to be a labral injury that has improved. 3. Pain of left lower extremity    Patient was seen and examined by Dr. Julieanne Mansonichard Jacia Sickman, and noted scribed by Dimas ChyleBrittany Byrd, CMA  Julieanne Mansonichard Markie Heffernan MD [facility] 11/26/2015 3:32 PM

## 2016-02-18 ENCOUNTER — Ambulatory Visit (INDEPENDENT_AMBULATORY_CARE_PROVIDER_SITE_OTHER): Payer: BLUE CROSS/BLUE SHIELD | Admitting: Family Medicine

## 2016-02-18 ENCOUNTER — Encounter: Payer: Self-pay | Admitting: Family Medicine

## 2016-02-18 VITALS — BP 116/72 | HR 64 | Temp 97.8°F | Resp 16 | Wt 221.0 lb

## 2016-02-18 DIAGNOSIS — A084 Viral intestinal infection, unspecified: Secondary | ICD-10-CM

## 2016-02-18 MED ORDER — SILDENAFIL CITRATE 20 MG PO TABS: 20.0000 mg | ORAL_TABLET | Freq: Every day | ORAL | 11 refills | Status: DC | PRN

## 2016-02-18 NOTE — Patient Instructions (Signed)
Use over the counter imodium for severe diarrhea

## 2016-02-18 NOTE — Progress Notes (Signed)
Subjective:  HPI Diarrhea- Pt is here today for diarrhea for more than 48 hours. Started Late Monday or early Tuesday. Pt also having LLQ pain. Does not seem to be related to meals, however is eating very little. He has a BM 5-6 times a day and has been in ned for the past 3 days. Denies nausea or vomiting. He has noticed blood on the toilet paper when he wipes. No one else in the house has been sick.   Prior to Admission medications   Medication Sig Start Date End Date Taking? Authorizing Provider  levothyroxine (SYNTHROID, LEVOTHROID) 125 MCG tablet Take 1 tablet (125 mcg total) by mouth daily. 05/01/15   Merri Dimaano Hulen Shouts., MD  naproxen (NAPROSYN) 500 MG tablet Take 1 tablet (500 mg total) by mouth 2 (two) times daily with a meal. 04/09/15   Maple Hudson., MD  omeprazole (PRILOSEC) 20 MG capsule Take 1 capsule (20 mg total) by mouth daily. 11/05/15   Elasia Furnish Hulen Shouts., MD  predniSONE (STERAPRED UNI-PAK 48 TAB) 10 MG (48) TBPK tablet Take by mouth daily. Prednisone taper as directed. Patient not taking: Reported on 11/26/2015 11/05/15   Maple Hudson., MD  tamsulosin (FLOMAX) 0.4 MG CAPS capsule Take 1 capsule (0.4 mg total) by mouth daily. Patient not taking: Reported on 05/13/2015 04/09/15   Maple Hudson., MD    Patient Active Problem List   Diagnosis Date Noted  . Angio-edema 12/11/2014  . Adult hypothyroidism 12/11/2014  . Hypercholesterolemia without hypertriglyceridemia 12/11/2014  . Avitaminosis D 12/11/2014  . OCD (obsessive compulsive disorder) 12/09/2014  . Vitamin D deficiency 12/09/2014  . Hyperlipidemia 12/09/2014  . Lazy eye 12/09/2014  . Allergic rhinitis 12/09/2014  . GERD (gastroesophageal reflux disease) 12/09/2014  . PUD (peptic ulcer disease) 12/09/2014  . 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 Hypothyroidism 05/12/2012    Past Medical History:  Diagnosis Date  . GERD (gastroesophageal reflux  disease)     Social History   Social History  . Marital status: Married    Spouse name: N/A  . Number of children: N/A  . Years of education: N/A   Occupational History  . Not on file.   Social History Main Topics  . Smoking status: Former Games developer  . Smokeless tobacco: Never Used  . Alcohol use 12.6 oz/week    21 Cans of beer per week     Comment: couple drinks every other day  . Drug use: No  . Sexual activity: Yes    Birth control/ protection: None   Other Topics Concern  . Not on file   Social History Narrative  . No narrative on file    No Known Allergies  Review of Systems  Constitutional: Positive for malaise/fatigue.  HENT: Negative.   Eyes: Negative.   Respiratory: Negative.   Cardiovascular: Negative.   Gastrointestinal: Positive for abdominal pain, blood in stool and diarrhea.  Genitourinary: Negative.   Musculoskeletal: Negative.   Skin: Negative.   Neurological: Negative.   Endo/Heme/Allergies: Negative.   Psychiatric/Behavioral: Negative.     Immunization History  Administered Date(s) Administered  . Influenza,inj,Quad PF,36+ Mos 05/13/2015  . Tdap 03/31/2005, 05/06/2011, 05/13/2015   Objective:  There were no vitals taken for this visit.  Physical Exam  Constitutional: He is oriented to person, place, and time and well-developed, well-nourished, and in no distress.  HENT:  Head: Normocephalic and atraumatic.  Eyes: Conjunctivae and EOM are normal. Pupils  are equal, round, and reactive to light.  Neck: Normal range of motion. Neck supple.  Cardiovascular: Normal rate, regular rhythm, normal heart sounds and intact distal pulses.   Pulmonary/Chest: Effort normal and breath sounds normal.  Abdominal: Soft. Bowel sounds are normal.  Musculoskeletal: Normal range of motion.  Neurological: He is alert and oriented to person, place, and time. He has normal reflexes. Gait normal. GCS score is 15.  Skin: Skin is warm and dry.  Psychiatric: Mood,  memory, affect and judgment normal.    Lab Results  Component Value Date   WBC 6.3 05/13/2015   HGB 14.7 08/08/2013   HCT 42.4 05/13/2015   PLT 203 05/13/2015   GLUCOSE 94 05/13/2015   CHOL 203 (H) 05/13/2015   TRIG 51 05/13/2015   HDL 77 05/13/2015   LDLCALC 116 (H) 05/13/2015   TSH 3.390 05/13/2015    CMP     Component Value Date/Time   NA 139 05/13/2015 1203   NA 142 05/29/2012 1653   K 4.2 05/13/2015 1203   K 3.7 05/29/2012 1653   CL 98 05/13/2015 1203   CL 106 05/29/2012 1653   CO2 26 05/13/2015 1203   CO2 28 05/29/2012 1653   GLUCOSE 94 05/13/2015 1203   GLUCOSE 112 (H) 05/29/2012 1653   BUN 16 05/13/2015 1203   BUN 20 (H) 05/29/2012 1653   CREATININE 0.86 05/13/2015 1203   CREATININE 1.09 05/29/2012 1653   CALCIUM 10.1 05/13/2015 1203   CALCIUM 9.1 05/29/2012 1653   PROT 6.8 05/13/2015 1203   PROT 7.9 05/29/2012 1653   ALBUMIN 4.5 05/13/2015 1203   ALBUMIN 4.6 05/29/2012 1653   AST 17 05/13/2015 1203   AST 21 05/29/2012 1653   ALT 16 05/13/2015 1203   ALT 29 05/29/2012 1653   ALKPHOS 59 05/13/2015 1203   ALKPHOS 85 05/29/2012 1653   BILITOT 0.6 05/13/2015 1203   BILITOT 0.7 05/29/2012 1653   GFRNONAA 112 05/13/2015 1203   GFRNONAA >60 05/29/2012 1653   GFRAA 130 05/13/2015 1203   GFRAA >60 05/29/2012 1653    Assessment and Plan :  1. Viral enteritis Instructed to try Imodium OTC   HPI, Exam, and A&P Transcribed under the direction and in the presence of Amali Uhls L. Wendelyn BreslowGilbert Jr, MD  Electronically Signed: Dimas ChyleBrittany Byrd, CMA I have done the exam and reviewed the above chart and it is accurate to the best of my knowledge.  Julieanne Mansonichard Harjot Zavadil MD [facility] 02/18/2016 9:25 AM

## 2016-04-17 DIAGNOSIS — K219 Gastro-esophageal reflux disease without esophagitis: Secondary | ICD-10-CM | POA: Diagnosis not present

## 2016-05-21 ENCOUNTER — Encounter: Payer: Self-pay | Admitting: Family Medicine

## 2016-05-24 ENCOUNTER — Telehealth: Payer: Self-pay

## 2016-05-24 MED ORDER — PANTOPRAZOLE SODIUM 40 MG PO TBEC: 40.0000 mg | DELAYED_RELEASE_TABLET | Freq: Every day | ORAL | 2 refills | Status: DC

## 2016-05-24 NOTE — Telephone Encounter (Signed)
RX sent in, patient advised through my chart-aa

## 2016-05-24 NOTE — Telephone Encounter (Signed)
Pantoprazole 40 mg daily daily when necessary, #30, 2 refills. see me this spring.

## 2016-05-24 NOTE — Telephone Encounter (Signed)
My name is Sean Dean and Im a patient of Dr. Sullivan LoneGilbert. I had to visit an urgent care on the sunday following thanksgiving due to a severe case of acid reflux. I was prescribed 40 mg tablets of protonix (quantity 30) by Kennieth RadJenny Brooke Baucom. Its helped a lot. To request a refill do i need to come in for a visit or would yall be able to authorize a refill without a visit due to my history. Thank you kindly for your time. Please review-aa this was sent through my chart-aa

## 2016-05-31 ENCOUNTER — Other Ambulatory Visit: Payer: Self-pay | Admitting: Family Medicine

## 2016-05-31 DIAGNOSIS — E039 Hypothyroidism, unspecified: Secondary | ICD-10-CM

## 2016-06-23 DIAGNOSIS — M545 Low back pain: Secondary | ICD-10-CM | POA: Diagnosis not present

## 2016-06-23 DIAGNOSIS — M25562 Pain in left knee: Secondary | ICD-10-CM | POA: Diagnosis not present

## 2016-06-23 DIAGNOSIS — S73192A Other sprain of left hip, initial encounter: Secondary | ICD-10-CM | POA: Diagnosis not present

## 2016-07-06 DIAGNOSIS — M25552 Pain in left hip: Secondary | ICD-10-CM | POA: Diagnosis not present

## 2016-08-22 DIAGNOSIS — E039 Hypothyroidism, unspecified: Secondary | ICD-10-CM | POA: Diagnosis not present

## 2016-08-22 DIAGNOSIS — M25552 Pain in left hip: Secondary | ICD-10-CM | POA: Diagnosis not present

## 2016-08-29 DIAGNOSIS — M25552 Pain in left hip: Secondary | ICD-10-CM | POA: Diagnosis not present

## 2016-10-11 DIAGNOSIS — B360 Pityriasis versicolor: Secondary | ICD-10-CM | POA: Diagnosis not present

## 2016-12-18 IMAGING — CR DG HIP (WITH OR WITHOUT PELVIS) 2-3V*L*
1 series · 3 of 3 positions shown · non-contrast
Comparison: None.

CLINICAL DATA: 34-year-old male with intermittent left hip/femur
pain for 2 years. No known injury.

EXAM:
DG HIP (WITH OR WITHOUT PELVIS) 2-3V LEFT

[Series 1: dg hip unilat w or w/o pelvis 2-3 views  · non-contrast · 0.14mm/px · 3 of 3 slices shown]
[im 1/3]
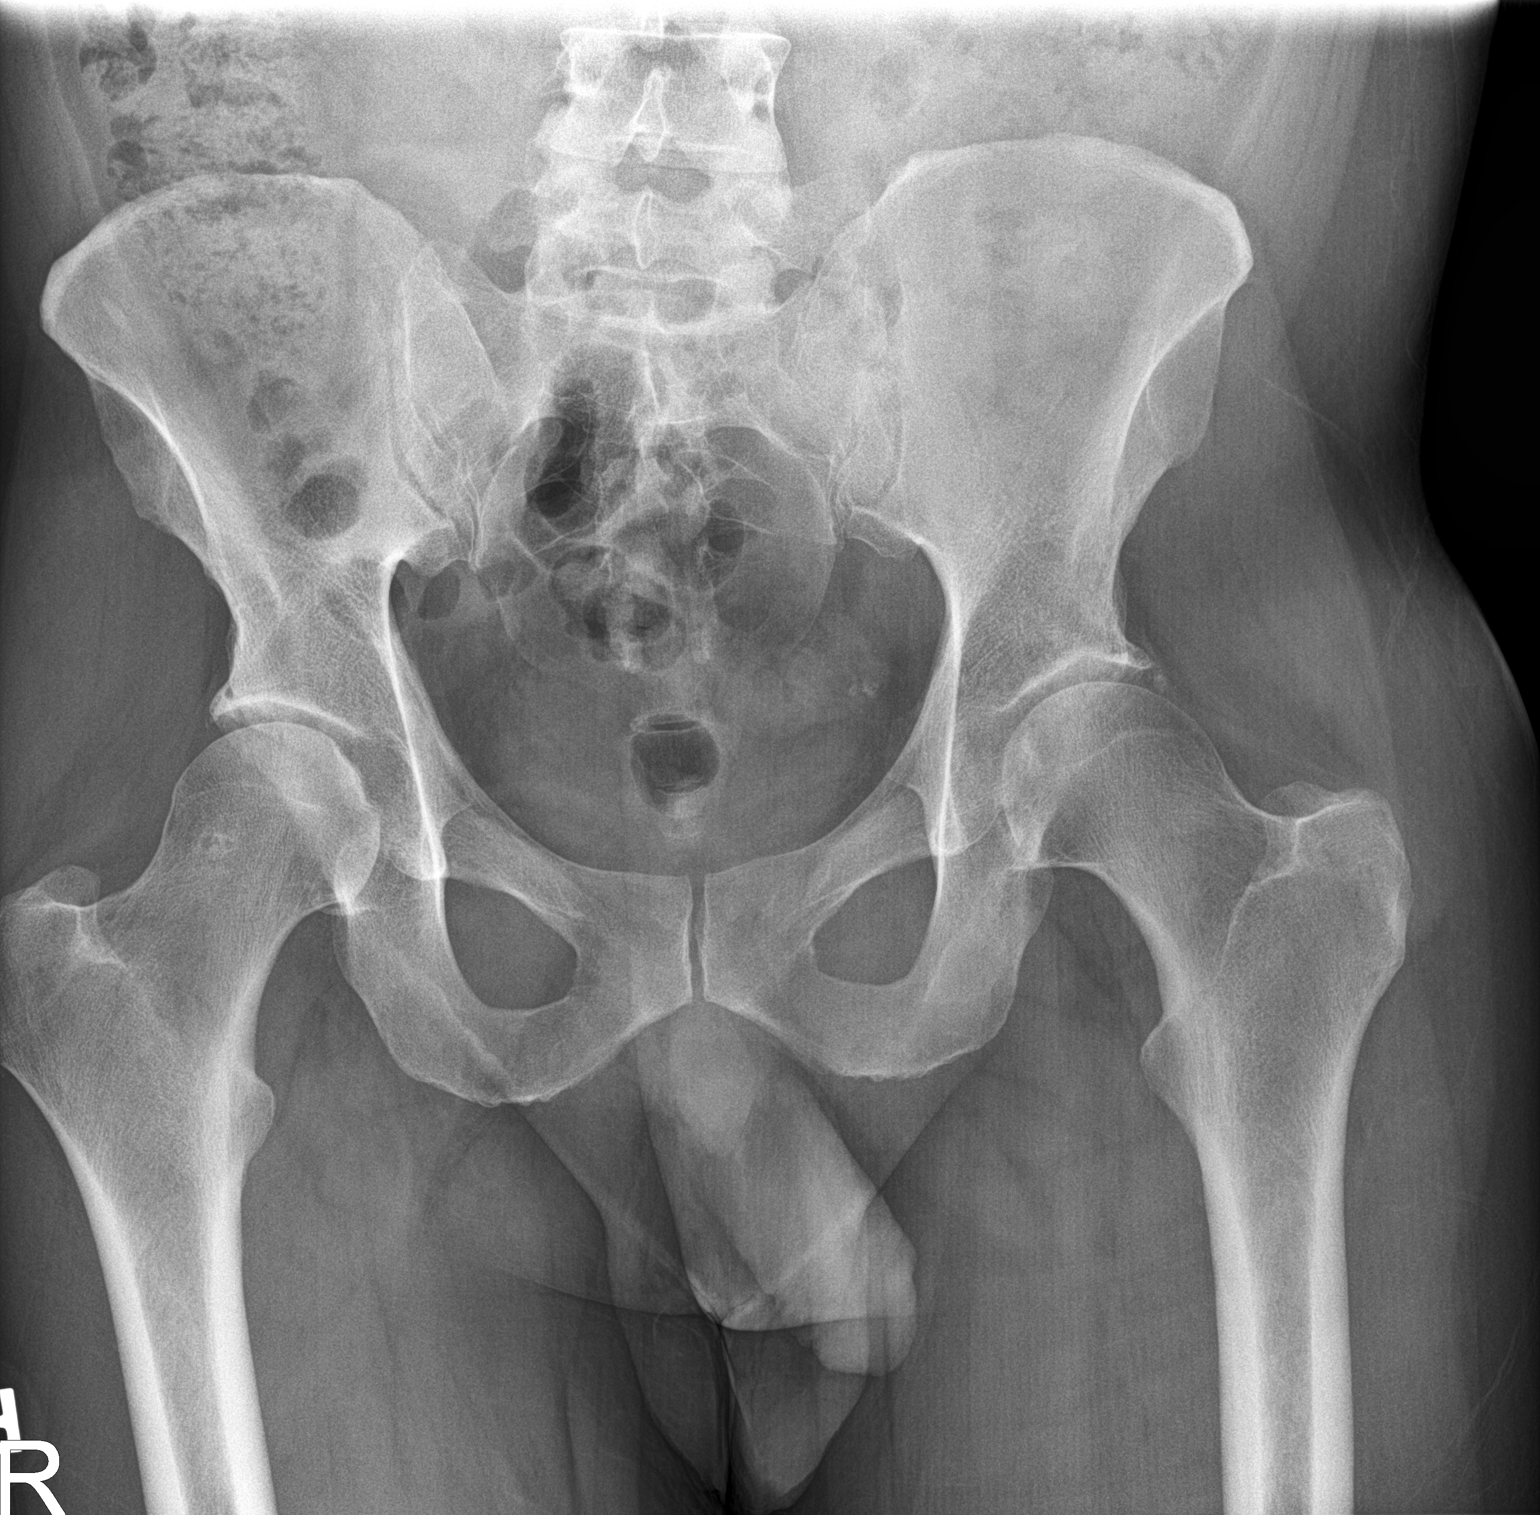
[im 2/3]
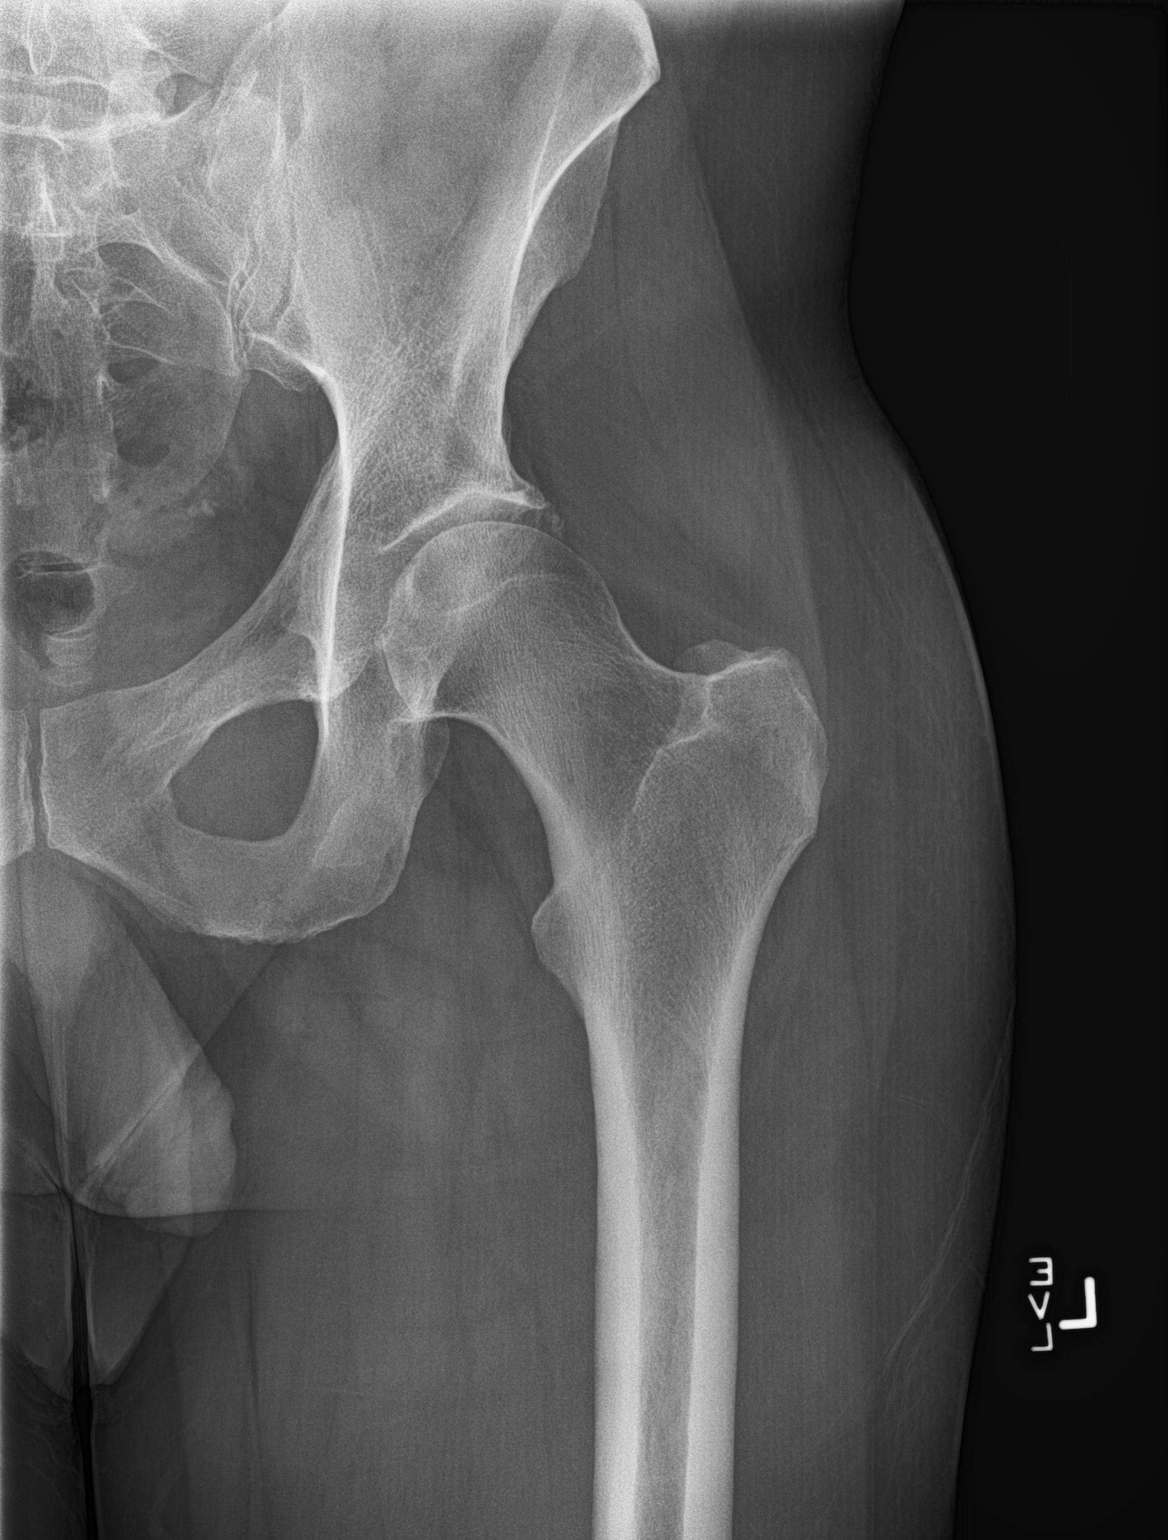
[im 3/3]
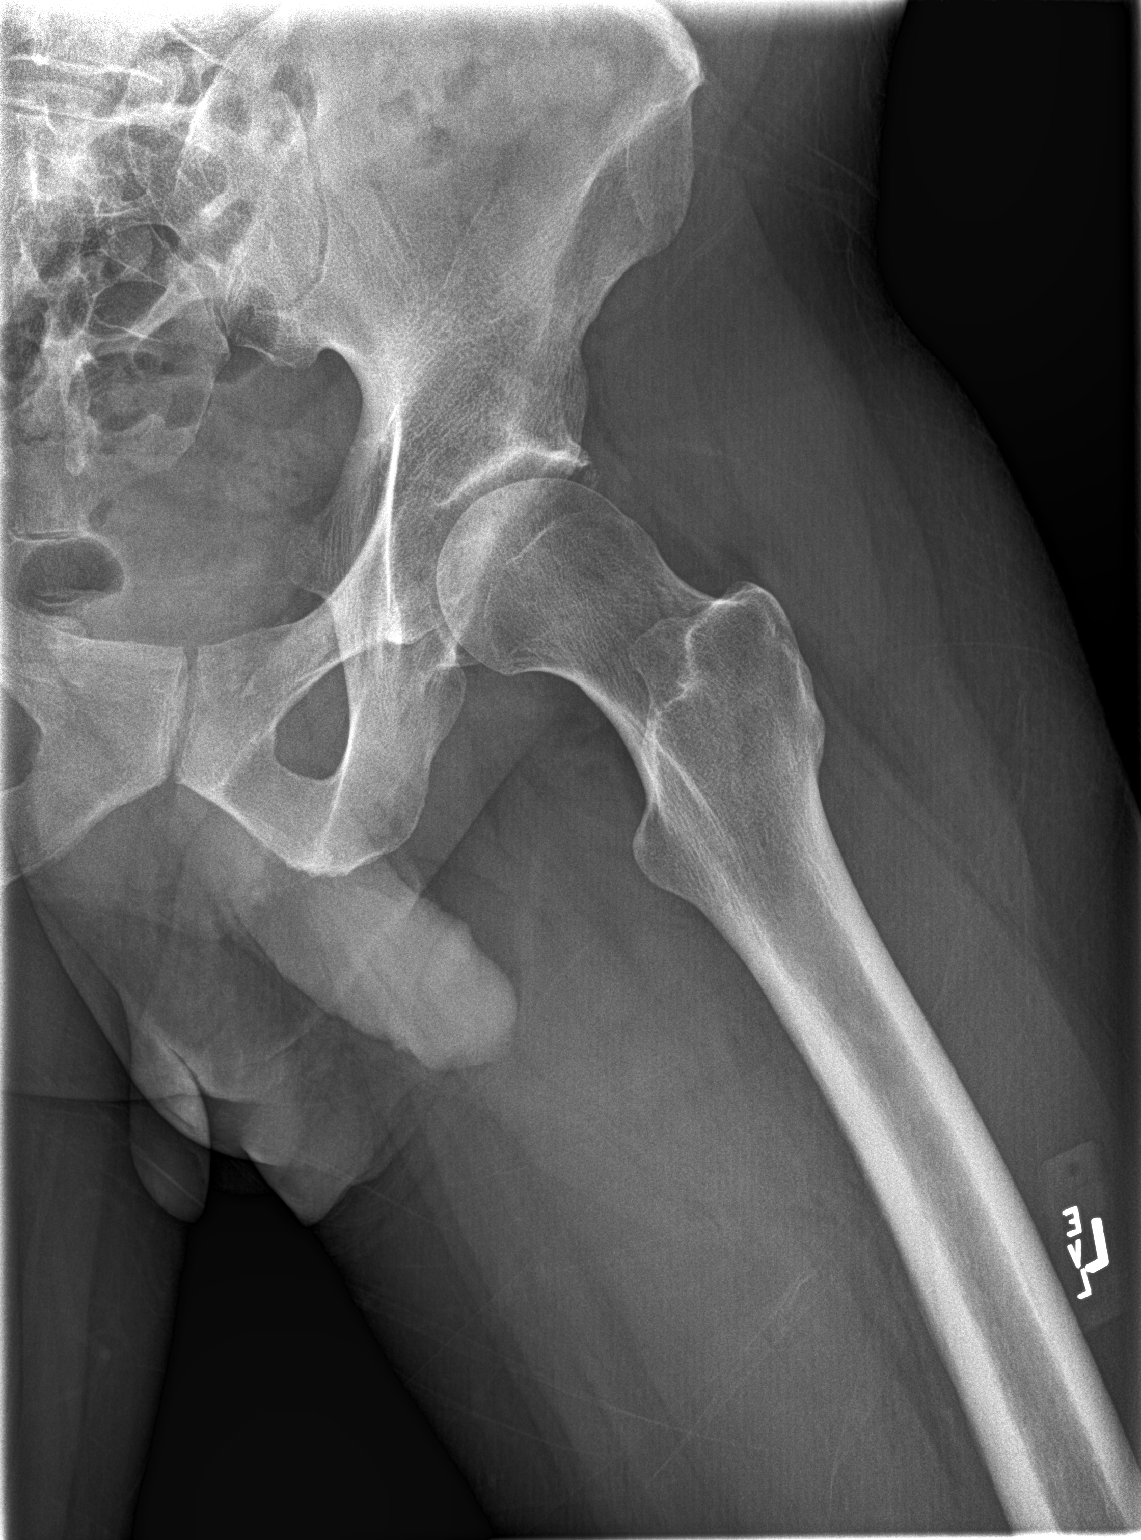

[3 of 3 positions shown; findings below may reference images not displayed]

FINDINGS: Single view of the pelvis and two views of the left hip are
provided. Osseous alignment is normal. Bone mineralization is
normal. No acute or suspicious osseous lesion. Mild osseous spurring
is seen adjacent to the superior-lateral margin of the left
acetabulum. Soft tissues about the pelvis and left hip are
unremarkable.
IMPRESSION: 1. Mild osseous spurring along the superior-lateral margin of the
left acetabulum. This can indicate underlying labral injury.
Consider MRI for further characterization.
2. No acute findings.

## 2017-04-18 DIAGNOSIS — J189 Pneumonia, unspecified organism: Secondary | ICD-10-CM | POA: Diagnosis not present

## 2017-06-17 ENCOUNTER — Other Ambulatory Visit: Payer: Self-pay | Admitting: Family Medicine

## 2017-06-17 DIAGNOSIS — E039 Hypothyroidism, unspecified: Secondary | ICD-10-CM

## 2017-07-23 ENCOUNTER — Other Ambulatory Visit: Payer: Self-pay | Admitting: Family Medicine

## 2017-07-23 DIAGNOSIS — E039 Hypothyroidism, unspecified: Secondary | ICD-10-CM

## 2017-07-24 ENCOUNTER — Other Ambulatory Visit: Payer: Self-pay | Admitting: Family Medicine

## 2017-07-24 DIAGNOSIS — E039 Hypothyroidism, unspecified: Secondary | ICD-10-CM

## 2017-07-27 ENCOUNTER — Other Ambulatory Visit: Payer: Self-pay | Admitting: Family Medicine

## 2017-07-27 DIAGNOSIS — E039 Hypothyroidism, unspecified: Secondary | ICD-10-CM

## 2017-08-25 ENCOUNTER — Other Ambulatory Visit: Payer: Self-pay | Admitting: Family Medicine

## 2017-08-25 DIAGNOSIS — E039 Hypothyroidism, unspecified: Secondary | ICD-10-CM

## 2017-08-29 ENCOUNTER — Other Ambulatory Visit: Payer: Self-pay | Admitting: Family Medicine

## 2017-08-29 DIAGNOSIS — E039 Hypothyroidism, unspecified: Secondary | ICD-10-CM

## 2017-08-31 ENCOUNTER — Other Ambulatory Visit: Payer: Self-pay | Admitting: Family Medicine

## 2017-08-31 DIAGNOSIS — E039 Hypothyroidism, unspecified: Secondary | ICD-10-CM

## 2017-09-03 ENCOUNTER — Other Ambulatory Visit: Payer: Self-pay | Admitting: Family Medicine

## 2017-09-03 DIAGNOSIS — E039 Hypothyroidism, unspecified: Secondary | ICD-10-CM

## 2017-09-05 ENCOUNTER — Other Ambulatory Visit: Payer: Self-pay

## 2017-09-05 DIAGNOSIS — E039 Hypothyroidism, unspecified: Secondary | ICD-10-CM

## 2017-09-05 MED ORDER — LEVOTHYROXINE SODIUM 125 MCG PO TABS: 125.0000 ug | ORAL_TABLET | Freq: Every day | ORAL | 0 refills | Status: DC

## 2017-09-13 ENCOUNTER — Ambulatory Visit: Payer: BLUE CROSS/BLUE SHIELD | Admitting: Family Medicine

## 2017-09-13 ENCOUNTER — Encounter: Payer: Self-pay | Admitting: Family Medicine

## 2017-09-13 VITALS — BP 132/86 | HR 66 | Temp 97.6°F | Resp 16 | Wt 228.0 lb

## 2017-09-13 DIAGNOSIS — F428 Other obsessive-compulsive disorder: Secondary | ICD-10-CM | POA: Diagnosis not present

## 2017-09-13 DIAGNOSIS — E039 Hypothyroidism, unspecified: Secondary | ICD-10-CM | POA: Diagnosis not present

## 2017-09-13 DIAGNOSIS — F320 Major depressive disorder, single episode, mild: Secondary | ICD-10-CM

## 2017-09-13 DIAGNOSIS — L509 Urticaria, unspecified: Secondary | ICD-10-CM | POA: Diagnosis not present

## 2017-09-13 MED ORDER — DOXEPIN HCL 25 MG PO CAPS: 25.0000 mg | ORAL_CAPSULE | Freq: Every day | ORAL | 12 refills | Status: DC

## 2017-09-13 NOTE — Progress Notes (Signed)
Sean Dean  MRN: [id number] DOB: November 22, 1980  Subjective:  HPI   The patient is a 37 year old male who presents for follow up of his hypothyroidism.  The patientt was last seen for this at the time of his physical on 05/13/15.  That is also the last time he has had any labs done.   The patient states he is under some increased stress.  He has been married for about 2 years and now has step children.  He recently took a job in [place] which results in him staying in [place] overnight at times, adding to the stress. Now married with 2 surgical stepdaughters.  Patient Active Problem List   Diagnosis Date Noted  . Angio-edema 12/11/2014  . Adult hypothyroidism 12/11/2014  . Hypercholesterolemia without hypertriglyceridemia 12/11/2014  . Avitaminosis D 12/11/2014  . OCD (obsessive compulsive disorder) 12/09/2014  . Vitamin D deficiency 12/09/2014  . Hyperlipidemia 12/09/2014  . Lazy eye 12/09/2014  . Allergic rhinitis 12/09/2014  . GERD (gastroesophageal reflux disease) 12/09/2014  . PUD (peptic ulcer disease) 12/09/2014  . 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 Hypothyroidism 05/12/2012    Past Medical History:  Diagnosis Date  . GERD (gastroesophageal reflux disease)     Social History   Socioeconomic History  . Marital status: Married    Spouse name: Not on file  . Number of children: Not on file  . Years of education: Not on file  . Highest education level: Not on file  Occupational History  . Not on file  Social Needs  . Financial resource strain: Not on file  . Food insecurity:    Worry: Not on file    Inability: Not on file  . Transportation needs:    Medical: Not on file    Non-medical: Not on file  Tobacco Use  . Smoking status: Former Games developer  . Smokeless tobacco: Never Used  Substance and Sexual Activity  . Alcohol use: Yes    Alcohol/week: 12.6 oz    Types: 21 Cans of beer per week    Comment: couple drinks every  other day  . Drug use: No  . Sexual activity: Yes    Birth control/protection: None  Lifestyle  . Physical activity:    Days per week: Not on file    Minutes per session: Not on file  . Stress: Not on file  Relationships  . Social connections:    Talks on phone: Not on file    Gets together: Not on file    Attends religious service: Not on file    Active member of club or organization: Not on file    Attends meetings of clubs or organizations: Not on file    Relationship status: Not on file  . Intimate partner violence:    Fear of current or ex partner: Not on file    Emotionally abused: Not on file    Physically abused: Not on file    Forced sexual activity: Not on file  Other Topics Concern  . Not on file  Social History Narrative  . Not on file    Outpatient Encounter Medications as of 09/13/2017  Medication Sig  . levothyroxine (SYNTHROID, LEVOTHROID) 125 MCG tablet Take 1 tablet (125 mcg total) by mouth daily.  . naproxen (NAPROSYN) 500 MG tablet Take 1 tablet (500 mg total) by mouth 2 (two) times daily with a meal.  . [DISCONTINUED] omeprazole (PRILOSEC) 20 MG capsule Take 1 capsule (20  mg total) by mouth daily. (Patient not taking: Reported on 02/18/2016)  . [DISCONTINUED] pantoprazole (PROTONIX) 40 MG tablet Take 1 tablet (40 mg total) by mouth daily.  . [DISCONTINUED] tamsulosin (FLOMAX) 0.4 MG CAPS capsule Take 1 capsule (0.4 mg total) by mouth daily. (Patient not taking: Reported on 02/18/2016)   No facility-administered encounter medications on file as of 09/13/2017.     No Known Allergies  Review of Systems  Constitutional: Negative for fever and weight loss.  Respiratory: Negative for cough, shortness of breath and wheezing.   Cardiovascular: Negative for chest pain, palpitations, orthopnea, claudication and leg swelling.  Neurological: Negative for dizziness and headaches.  Psychiatric/Behavioral: Negative for depression, memory loss, substance abuse (Patient  admitws to drinking more than he thinks he should) and suicidal ideas. The patient is nervous/anxious. The patient does not have insomnia.     Objective:  BP 132/86 (BP Location: Right Arm, Patient Position: Sitting, Cuff Size: Normal)   Pulse 66   Temp 97.6 F (36.4 C) (Oral)   Resp 16   Wt 228 lb (103.4 kg)   BMI 27.75 kg/m   Physical Exam  Constitutional: He is oriented to person, place, and time and well-developed, well-nourished, and in no distress.  HENT:  Head: Normocephalic and atraumatic.  Eyes: Conjunctivae are normal. No scleral icterus.  Neck: No thyromegaly present.  Cardiovascular: Normal rate, regular rhythm, normal heart sounds and intact distal pulses.  Pulmonary/Chest: Effort normal and breath sounds normal.  Abdominal: Soft.  Lymphadenopathy:    He has no cervical adenopathy.  Neurological: He is alert and oriented to person, place, and time. Gait normal. GCS score is 15.  Skin: Skin is warm and dry.  Psychiatric: Mood, memory, affect and judgment normal.    Assessment and Plan :  Hypothyroidism Check TSH. Depression Pt not suicidal. Try Doxepin 25 mg q hs as he still has some urticaria. Uticaria RTC 2 months for recheck and CPE.  I have done the exam and reviewed the chart and it is accurate to the best of my knowledge. DentistDragon  technology has been used and  any errors in dictation or transcription are unintentional. Julieanne Mansonichard Gilbert M.D. [facility]

## 2017-09-14 LAB — COMPREHENSIVE METABOLIC PANEL
ALK PHOS: 68 IU/L (ref 39–117)
ALT: 19 IU/L (ref 0–44)
AST: 19 IU/L (ref 0–40)
Albumin/Globulin Ratio: 1.9 (ref 1.2–2.2)
Albumin: 4.8 g/dL (ref 3.5–5.5)
BUN/Creatinine Ratio: 20 (ref 9–20)
BUN: 17 mg/dL (ref 6–20)
Bilirubin Total: 0.7 mg/dL (ref 0.0–1.2)
CALCIUM: 9.7 mg/dL (ref 8.7–10.2)
CO2: 23 mmol/L (ref 20–29)
CREATININE: 0.87 mg/dL (ref 0.76–1.27)
Chloride: 102 mmol/L (ref 96–106)
GFR calc Af Amer: 127 mL/min/{1.73_m2} (ref >59)
GFR, EST NON AFRICAN AMERICAN: 110 mL/min/{1.73_m2} (ref >59)
Globulin, Total: 2.5 g/dL (ref 1.5–4.5)
Glucose: 82 mg/dL (ref 65–99)
Potassium: 4.1 mmol/L (ref 3.5–5.2)
SODIUM: 141 mmol/L (ref 134–144)
Total Protein: 7.3 g/dL (ref 6.0–8.5)

## 2017-09-14 LAB — CBC WITH DIFFERENTIAL/PLATELET
BASOS ABS: 0 10*3/uL (ref 0.0–0.2)
Basos: 0 %
EOS (ABSOLUTE): 0.1 10*3/uL (ref 0.0–0.4)
EOS: 2 %
HEMATOCRIT: 41.8 % (ref 37.5–51.0)
Hemoglobin: 14.7 g/dL (ref 13.0–17.7)
IMMATURE GRANULOCYTES: 0 %
Immature Grans (Abs): 0 10*3/uL (ref 0.0–0.1)
LYMPHS ABS: 1.8 10*3/uL (ref 0.7–3.1)
Lymphs: 28 %
MCH: 31.7 pg (ref 26.6–33.0)
MCHC: 35.2 g/dL (ref 31.5–35.7)
MCV: 90 fL (ref 79–97)
MONOS ABS: 0.6 10*3/uL (ref 0.1–0.9)
Monocytes: 9 %
Neutrophils Absolute: 4 10*3/uL (ref 1.4–7.0)
Neutrophils: 61 %
Platelets: 220 10*3/uL (ref 150–379)
RBC: 4.63 x10E6/uL (ref 4.14–5.80)
RDW: 13.1 % (ref 12.3–15.4)
WBC: 6.5 10*3/uL (ref 3.4–10.8)

## 2017-09-14 LAB — TSH: TSH: 14.98 u[IU]/mL — AB (ref 0.450–4.500)

## 2017-09-17 ENCOUNTER — Encounter: Payer: Self-pay | Admitting: Family Medicine

## 2017-09-17 ENCOUNTER — Other Ambulatory Visit: Payer: Self-pay | Admitting: Family Medicine

## 2017-09-17 DIAGNOSIS — E039 Hypothyroidism, unspecified: Secondary | ICD-10-CM

## 2017-09-18 ENCOUNTER — Telehealth: Payer: Self-pay

## 2017-09-18 MED ORDER — LEVOTHYROXINE SODIUM 150 MCG PO TABS: 150.0000 ug | ORAL_TABLET | Freq: Every day | ORAL | 1 refills | Status: DC

## 2017-09-18 NOTE — Telephone Encounter (Signed)
-----   Message from Sean Dean., MD sent at 09/14/2017  2:37 PM EDT ----- Increase synthroiud from 125 to daily.

## 2017-09-18 NOTE — Telephone Encounter (Signed)
LMTCB 09/18/2017   Thanks,   -Laura  

## 2017-09-18 NOTE — Telephone Encounter (Signed)
Pt advised.  Rx sent to [facility].   Thanks,   -Vernona Rieger

## 2017-09-19 MED ORDER — LEVOTHYROXINE SODIUM 125 MCG PO TABS: 125.0000 ug | ORAL_TABLET | Freq: Every day | ORAL | 11 refills | Status: DC

## 2017-10-19 ENCOUNTER — Encounter: Payer: Self-pay | Admitting: Family Medicine

## 2017-10-24 ENCOUNTER — Other Ambulatory Visit: Payer: Self-pay

## 2017-10-24 MED ORDER — LEVOTHYROXINE SODIUM 150 MCG PO TABS: 150.0000 ug | ORAL_TABLET | Freq: Every day | ORAL | 5 refills | Status: DC

## 2017-11-16 ENCOUNTER — Ambulatory Visit: Payer: Self-pay | Admitting: Family Medicine

## 2017-11-22 ENCOUNTER — Other Ambulatory Visit: Payer: Self-pay

## 2017-11-22 ENCOUNTER — Encounter: Payer: Self-pay | Admitting: Family Medicine

## 2017-11-22 MED ORDER — LEVOTHYROXINE SODIUM 150 MCG PO TABS: 150.0000 ug | ORAL_TABLET | Freq: Every day | ORAL | 5 refills | Status: DC

## 2017-12-11 ENCOUNTER — Ambulatory Visit: Payer: BLUE CROSS/BLUE SHIELD | Admitting: Family Medicine

## 2017-12-11 ENCOUNTER — Encounter: Payer: Self-pay | Admitting: Family Medicine

## 2017-12-11 VITALS — BP 126/70 | HR 64 | Temp 97.6°F | Resp 16 | Wt 239.0 lb

## 2017-12-11 DIAGNOSIS — E039 Hypothyroidism, unspecified: Secondary | ICD-10-CM | POA: Diagnosis not present

## 2017-12-11 NOTE — Progress Notes (Signed)
Patient: Sean Dean Male    DOB: 1981-05-18   37 y.o.   MRN: [id number] Visit Date: 12/11/2017  Today's Provider: Megan Mansichard Gilbert Jr, MD   Chief Complaint  Patient presents with  . Hypothyroidism   Subjective:    Thyroid Problem  Presents for follow-up visit. Symptoms include weight gain. Patient reports no anxiety, cold intolerance, constipation, depressed mood, diaphoresis, diarrhea, dry skin, fatigue, hair loss, heat intolerance, hoarse voice, leg swelling, nail problem, palpitations, tremors, visual change or weight loss. The symptoms have been stable.  Overall he feels well and knows he has been eating too much for him.     No Known Allergies   Current Outpatient Medications:  .  levothyroxine (SYNTHROID, LEVOTHROID) 150 MCG tablet, Take 1 tablet (150 mcg total) by mouth daily., Disp: 30 tablet, Rfl: 5 .  naproxen (NAPROSYN) 500 MG tablet, Take 1 tablet (500 mg total) by mouth 2 (two) times daily with a meal., Disp: 60 tablet, Rfl: 5 .  doxepin (SINEQUAN) 25 MG capsule, Take 1 capsule (25 mg total) by mouth at bedtime. (Patient not taking: Reported on 12/11/2017), Disp: 30 capsule, Rfl: 12  Review of Systems  Constitutional: Positive for weight gain. Negative for activity change, appetite change, chills, diaphoresis, fatigue, fever, unexpected weight change and weight loss.  HENT: Negative for hoarse voice and trouble swallowing.   Respiratory: Negative.   Cardiovascular: Negative.  Negative for palpitations.  Gastrointestinal: Negative.  Negative for constipation and diarrhea.  Endocrine: Negative for cold intolerance and heat intolerance.  Neurological: Negative for dizziness, tremors, light-headedness and headaches.  Psychiatric/Behavioral: The patient is not nervous/anxious.     Social History   Tobacco Use  . Smoking status: Former Games developermoker  . Smokeless tobacco: Never Used  Substance Use Topics  . Alcohol use: Yes    Alcohol/week: 18.0 oz    Types:  30 Cans of beer per week    Comment: 3-4 drinks per day   Objective:   BP 126/70 (BP Location: Right Arm, Patient Position: Sitting, Cuff Size: Large)   Pulse 64   Temp 97.6 F (36.4 C) (Oral)   Resp 16   Wt 239 lb (108.4 kg)   BMI 29.09 kg/m  Vitals:   12/11/17 0827  BP: 126/70  Pulse: 64  Resp: 16  Temp: 97.6 F (36.4 C)  TempSrc: Oral  Weight: 239 lb (108.4 kg)     Physical Exam  Constitutional: He is oriented to person, place, and time. He appears well-developed and well-nourished.  HENT:  Head: Normocephalic and atraumatic.  Right Ear: External ear normal.  Left Ear: External ear normal.  Nose: Nose normal.  Eyes: Conjunctivae are normal. No scleral icterus.  Neck: No thyromegaly present.  Cardiovascular: Normal rate, regular rhythm and normal heart sounds.  Pulmonary/Chest: Effort normal and breath sounds normal.  Musculoskeletal: He exhibits no edema.  Lymphadenopathy:    He has no cervical adenopathy.  Neurological: He is alert and oriented to person, place, and time.  Skin: Skin is warm and dry.  Psychiatric: He has a normal mood and affect. His behavior is normal. Judgment and thought content normal.        Assessment & Plan:     1. Adult hypothyroidism CPE early 2020. - TSH      I have done the exam and reviewed the above chart and it is accurate to the best of my knowledge. DentistDragon  technology has been used in this note in any  air is in the dictation or transcription are unintentional.  Wilhemena Durie, MD  [facility]

## 2017-12-12 LAB — TSH: TSH: 2.89 u[IU]/mL (ref 0.450–4.500)

## 2017-12-13 ENCOUNTER — Telehealth: Payer: Self-pay

## 2017-12-13 NOTE — Telephone Encounter (Signed)
Left message to call back  

## 2017-12-13 NOTE — Telephone Encounter (Signed)
-----   Message from Maple Hudsonichard L Gilbert Jr., MD sent at 12/12/2017 12:31 PM EDT ----- Thyroid now normal range--continue present dose.

## 2017-12-18 NOTE — Telephone Encounter (Signed)
Advised  ED 

## 2018-06-14 ENCOUNTER — Encounter: Payer: Self-pay | Admitting: Family Medicine

## 2018-06-14 ENCOUNTER — Ambulatory Visit: Payer: BLUE CROSS/BLUE SHIELD | Admitting: Family Medicine

## 2018-06-14 VITALS — BP 132/86 | HR 72 | Temp 97.9°F | Ht 76.0 in | Wt 241.0 lb

## 2018-06-14 DIAGNOSIS — E7849 Other hyperlipidemia: Secondary | ICD-10-CM | POA: Diagnosis not present

## 2018-06-14 DIAGNOSIS — E039 Hypothyroidism, unspecified: Secondary | ICD-10-CM | POA: Diagnosis not present

## 2018-06-14 NOTE — Progress Notes (Signed)
Patient: Sean Dean Male    DOB: 06-Jan-1981   38 y.o.   MRN: [id number] Visit Date: 06/14/2018  Today's Provider: Megan Mansichard Gilbert Jr, MD   Chief Complaint  Patient presents with  . Hypothyroidism   Subjective:     HPI  Patient comes in today for a follow up. He was last seen in the office 6 months ago. No changes were made in his medications. He is currently taking levothyroxine 150mcg daily, and reports good compliance and good symptom control.   Lab Results  Component Value Date   TSH 2.890 12/11/2017      No Known Allergies   Current Outpatient Medications:  .  levothyroxine (SYNTHROID, LEVOTHROID) 150 MCG tablet, Take 1 tablet (150 mcg total) by mouth daily., Disp: 30 tablet, Rfl: 5 .  naproxen (NAPROSYN) 500 MG tablet, Take 1 tablet (500 mg total) by mouth 2 (two) times daily with a meal., Disp: 60 tablet, Rfl: 5 .  doxepin (SINEQUAN) 25 MG capsule, Take 1 capsule (25 mg total) by mouth at bedtime. (Patient not taking: Reported on 12/11/2017), Disp: 30 capsule, Rfl: 12  Review of Systems  Constitutional: Negative for activity change, appetite change, chills, diaphoresis, fatigue, fever and unexpected weight change.  Cardiovascular: Negative for chest pain, palpitations and leg swelling.  Endocrine: Negative for cold intolerance, heat intolerance, polydipsia, polyphagia and polyuria.  Neurological: Negative for dizziness, light-headedness and headaches.    Social History   Tobacco Use  . Smoking status: Former Games developermoker  . Smokeless tobacco: Never Used  Substance Use Topics  . Alcohol use: Yes    Alcohol/week: 30.0 standard drinks    Types: 30 Cans of beer per week    Comment: 3-4 drinks per day      Objective:   BP 132/86 (BP Location: Left Arm, Patient Position: Sitting, Cuff Size: Normal)   Pulse 72   Temp 97.9 F (36.6 C)   Ht 6\' 4"  (1.93 m)   Wt 241 lb (109.3 kg)   SpO2 94%   BMI 29.34 kg/m  Vitals:   06/14/18 0815  BP: 132/86  Pulse:  72  Temp: 97.9 F (36.6 C)  SpO2: 94%  Weight: 241 lb (109.3 kg)  Height: 6\' 4"  (1.93 m)     Physical Exam Constitutional:      Appearance: He is well-developed.  HENT:     Head: Normocephalic and atraumatic.     Right Ear: External ear normal.     Left Ear: External ear normal.     Nose: Nose normal.  Eyes:     General: No scleral icterus.    Conjunctiva/sclera: Conjunctivae normal.  Neck:     Thyroid: No thyromegaly.  Cardiovascular:     Rate and Rhythm: Normal rate and regular rhythm.     Heart sounds: Normal heart sounds.  Pulmonary:     Effort: Pulmonary effort is normal.     Breath sounds: Normal breath sounds.  Lymphadenopathy:     Cervical: No cervical adenopathy.  Skin:    General: Skin is warm and dry.  Neurological:     Mental Status: He is alert and oriented to person, place, and time.  Psychiatric:        Behavior: Behavior normal.        Thought Content: Thought content normal.        Judgment: Judgment normal.         Assessment & Plan    1. Adult hypothyroidism  -  TSH  2. Other hyperlipidemia RTC later in year for CPE.  I have done the exam and reviewed the chart and it is accurate to the best of my knowledge. Dentist has been used and  any errors in dictation or transcription are unintentional. Julieanne Manson M.D. [facility]      Megan Mans, MD  [facility]

## 2018-06-15 LAB — TSH: TSH: 1.87 u[IU]/mL (ref 0.450–4.500)

## 2018-06-21 ENCOUNTER — Telehealth: Payer: Self-pay

## 2018-06-21 NOTE — Telephone Encounter (Signed)
Advised  ED 

## 2018-06-21 NOTE — Telephone Encounter (Signed)
-----   Message from Maple Hudsonichard L Gilbert Jr., MD sent at 06/21/2018 12:57 PM EST ----- TSH normal.  Continue present dose of Synthroid

## 2018-09-25 ENCOUNTER — Other Ambulatory Visit: Payer: Self-pay | Admitting: Family Medicine

## 2018-11-15 DIAGNOSIS — F411 Generalized anxiety disorder: Secondary | ICD-10-CM | POA: Diagnosis not present

## 2018-11-25 DIAGNOSIS — Z1159 Encounter for screening for other viral diseases: Secondary | ICD-10-CM | POA: Diagnosis not present

## 2018-11-29 DIAGNOSIS — F411 Generalized anxiety disorder: Secondary | ICD-10-CM | POA: Diagnosis not present

## 2018-12-20 DIAGNOSIS — F411 Generalized anxiety disorder: Secondary | ICD-10-CM | POA: Diagnosis not present

## 2019-04-25 ENCOUNTER — Telehealth: Payer: Self-pay

## 2019-04-25 ENCOUNTER — Encounter: Payer: Self-pay | Admitting: Family Medicine

## 2019-04-25 MED ORDER — LEVOTHYROXINE SODIUM 150 MCG PO TABS: 150.0000 ug | ORAL_TABLET | Freq: Every day | ORAL | 1 refills | Status: DC

## 2019-04-25 NOTE — Telephone Encounter (Signed)
The patient was called and LVM for them to schedule an appointment for further refills but one will be sent to his pharmacy.

## 2019-04-25 NOTE — Telephone Encounter (Signed)
Scheduled an appt for 05/27/19 for a fu for refills

## 2019-05-27 ENCOUNTER — Ambulatory Visit: Payer: BLUE CROSS/BLUE SHIELD | Admitting: Family Medicine

## 2019-06-04 ENCOUNTER — Ambulatory Visit: Payer: BLUE CROSS/BLUE SHIELD | Admitting: Family Medicine

## 2019-06-13 ENCOUNTER — Telehealth (INDEPENDENT_AMBULATORY_CARE_PROVIDER_SITE_OTHER): Payer: BC Managed Care – PPO | Admitting: Family Medicine

## 2019-06-13 ENCOUNTER — Encounter: Payer: Self-pay | Admitting: Family Medicine

## 2019-06-13 DIAGNOSIS — E559 Vitamin D deficiency, unspecified: Secondary | ICD-10-CM

## 2019-06-13 DIAGNOSIS — E039 Hypothyroidism, unspecified: Secondary | ICD-10-CM

## 2019-06-13 DIAGNOSIS — L508 Other urticaria: Secondary | ICD-10-CM

## 2019-06-13 DIAGNOSIS — J301 Allergic rhinitis due to pollen: Secondary | ICD-10-CM

## 2019-06-13 DIAGNOSIS — K219 Gastro-esophageal reflux disease without esophagitis: Secondary | ICD-10-CM | POA: Diagnosis not present

## 2019-06-13 DIAGNOSIS — E7800 Pure hypercholesterolemia, unspecified: Secondary | ICD-10-CM

## 2019-06-13 NOTE — Progress Notes (Signed)
       Patient: Sean Dean Male    DOB: 1980/08/15   39 y.o.   MRN: [id number] Visit Date: 06/13/2019  Today's Provider: Megan Mans, MD   Chief Complaint  Patient presents with  . Follow-up  . Hypothyroidism   Subjective:    Virtual Visit via Video Note  I connected with Sean Dean on 06/13/19 at 11:00 AM EST by a video enabled telemedicine application and verified that I am speaking with the correct person using two identifiers.  Location: Patient: Home Provider: Home   I discussed the limitations of evaluation and management by telemedicine and the availability of in person appointments. The patient expressed understanding and agreed to proceed.   HPI Doing well on synthroid. Needs lab work . Urticaria under control.No c/o. No Known Allergies   Current Outpatient Medications:  .  levothyroxine (SYNTHROID) 150 MCG tablet, Take 1 tablet (150 mcg total) by mouth daily., Disp: 90 tablet, Rfl: 1 .  naproxen (NAPROSYN) 500 MG tablet, Take 1 tablet (500 mg total) by mouth 2 (two) times daily with a meal., Disp: 60 tablet, Rfl: 5 .  doxepin (SINEQUAN) 25 MG capsule, Take 1 capsule (25 mg total) by mouth at bedtime. (Patient not taking: Reported on 12/11/2017), Disp: 30 capsule, Rfl: 12  Review of Systems  Constitutional: Negative for appetite change, chills and fever.  Respiratory: Negative for chest tightness, shortness of breath and wheezing.   Cardiovascular: Negative for chest pain and palpitations.  Gastrointestinal: Negative for abdominal pain, nausea and vomiting.    Social History   Tobacco Use  . Smoking status: Former Games developer  . Smokeless tobacco: Never Used  Substance Use Topics  . Alcohol use: Yes    Alcohol/week: 30.0 standard drinks    Types: 30 Cans of beer per week    Comment: 3-4 drinks per day      Objective:   There were no vitals taken for this visit. There were no vitals filed for this visit.There is no height or weight on  file to calculate BMI.   Physical Exam   No results found for any visits on 06/13/19.     Assessment & Plan     1. Pure hypercholesterolemia  - CBC w/Diff/Platelet - Comprehensive Metabolic Panel (CMET) - Lipid Profile  2. Hypothyroidism, unspecified type  - CBC w/Diff/Platelet - TSH  3. Gastroesophageal reflux disease, unspecified whether esophagitis present   4. Allergic rhinitis due to pollen, unspecified seasonality   5. Vitamin D deficiency   6. Autoimmune urticaria CPE later 2021.  I discussed the assessment and treatment plan with the patient. The patient was provided an opportunity to ask questions and all were answered. The patient agreed with the plan and demonstrated an understanding of the instructions.   The patient was advised to call back or seek an in-person evaluation if the symptoms worsen or if the condition fails to improve as anticipated.  I provided 12 minutes of non-face-to-face time during this encounter.    Richard Wendelyn Breslow, MD  [facility]

## 2019-06-17 DIAGNOSIS — M1612 Unilateral primary osteoarthritis, left hip: Secondary | ICD-10-CM | POA: Diagnosis not present

## 2019-06-17 DIAGNOSIS — M25552 Pain in left hip: Secondary | ICD-10-CM | POA: Diagnosis not present

## 2019-06-17 DIAGNOSIS — M5136 Other intervertebral disc degeneration, lumbar region: Secondary | ICD-10-CM | POA: Diagnosis not present

## 2019-06-21 DIAGNOSIS — E7800 Pure hypercholesterolemia, unspecified: Secondary | ICD-10-CM | POA: Diagnosis not present

## 2019-06-21 DIAGNOSIS — E039 Hypothyroidism, unspecified: Secondary | ICD-10-CM | POA: Diagnosis not present

## 2019-06-22 LAB — COMPREHENSIVE METABOLIC PANEL
ALT: 28 IU/L (ref 0–44)
AST: 22 IU/L (ref 0–40)
Albumin/Globulin Ratio: 1.9 (ref 1.2–2.2)
Albumin: 4.6 g/dL (ref 4.0–5.0)
Alkaline Phosphatase: 95 IU/L (ref 39–117)
BUN/Creatinine Ratio: 13 (ref 9–20)
BUN: 12 mg/dL (ref 6–20)
Bilirubin Total: 0.5 mg/dL (ref 0.0–1.2)
CO2: 21 mmol/L (ref 20–29)
Calcium: 9.7 mg/dL (ref 8.7–10.2)
Chloride: 103 mmol/L (ref 96–106)
Creatinine, Ser: 0.92 mg/dL (ref 0.76–1.27)
GFR calc Af Amer: 121 mL/min/{1.73_m2} (ref >59)
GFR calc non Af Amer: 104 mL/min/{1.73_m2} (ref >59)
Globulin, Total: 2.4 g/dL (ref 1.5–4.5)
Glucose: 91 mg/dL (ref 65–99)
Potassium: 4.3 mmol/L (ref 3.5–5.2)
Sodium: 140 mmol/L (ref 134–144)
Total Protein: 7 g/dL (ref 6.0–8.5)

## 2019-06-22 LAB — CBC WITH DIFFERENTIAL/PLATELET
Basophils Absolute: 0 10*3/uL (ref 0.0–0.2)
Basos: 0 %
EOS (ABSOLUTE): 0.1 10*3/uL (ref 0.0–0.4)
Eos: 3 %
Hematocrit: 41.8 % (ref 37.5–51.0)
Hemoglobin: 14.2 g/dL (ref 13.0–17.7)
Immature Grans (Abs): 0 10*3/uL (ref 0.0–0.1)
Immature Granulocytes: 0 %
Lymphocytes Absolute: 1.1 10*3/uL (ref 0.7–3.1)
Lymphs: 23 %
MCH: 31.1 pg (ref 26.6–33.0)
MCHC: 34 g/dL (ref 31.5–35.7)
MCV: 92 fL (ref 79–97)
Monocytes Absolute: 0.5 10*3/uL (ref 0.1–0.9)
Monocytes: 11 %
Neutrophils Absolute: 3.1 10*3/uL (ref 1.4–7.0)
Neutrophils: 63 %
Platelets: 203 10*3/uL (ref 150–450)
RBC: 4.57 x10E6/uL (ref 4.14–5.80)
RDW: 11.9 % (ref 11.6–15.4)
WBC: 4.9 10*3/uL (ref 3.4–10.8)

## 2019-06-22 LAB — LIPID PANEL
Chol/HDL Ratio: 3.8 ratio (ref 0.0–5.0)
Cholesterol, Total: 228 mg/dL — ABNORMAL HIGH (ref 100–199)
HDL: 60 mg/dL (ref >39)
LDL Chol Calc (NIH): 153 mg/dL — ABNORMAL HIGH (ref 0–99)
Triglycerides: 84 mg/dL (ref 0–149)
VLDL Cholesterol Cal: 15 mg/dL (ref 5–40)

## 2019-06-22 LAB — TSH: TSH: 1.97 u[IU]/mL (ref 0.450–4.500)

## 2019-06-26 ENCOUNTER — Telehealth: Payer: Self-pay

## 2019-06-26 DIAGNOSIS — M25552 Pain in left hip: Secondary | ICD-10-CM | POA: Diagnosis not present

## 2019-06-26 NOTE — Telephone Encounter (Signed)
Called and left vm for patient to call back for lab results.

## 2019-09-02 ENCOUNTER — Other Ambulatory Visit: Payer: Self-pay

## 2019-09-02 ENCOUNTER — Ambulatory Visit: Payer: Self-pay | Attending: Internal Medicine

## 2019-09-02 DIAGNOSIS — Z23 Encounter for immunization: Secondary | ICD-10-CM

## 2019-09-02 NOTE — Progress Notes (Signed)
   Covid-19 Vaccination Clinic  Name:  Sean Dean    MRN: [id number] DOB: 1981/03/05  09/02/2019  [name] was observed post Covid-19 immunization for 15 minutes without incident. He was provided with Vaccine Information Sheet and instruction to access the V-Safe system.   [name] was instructed to call 911 with any severe reactions post vaccine: Marland Kitchen Difficulty breathing  . Swelling of face and throat  . A fast heartbeat  . A bad rash all over body  . Dizziness and weakness   Immunizations Administered    Name Date Dose VIS Date Route   Pfizer COVID-19 Vaccine 09/02/2019 11:37 AM 0.3 mL 05/03/2019 Intramuscular   Manufacturer: [employer], [employer]   Lot: [id number]   NDC: [address]-8

## 2019-09-25 ENCOUNTER — Ambulatory Visit: Payer: Self-pay | Attending: Internal Medicine

## 2019-09-25 DIAGNOSIS — Z23 Encounter for immunization: Secondary | ICD-10-CM

## 2019-09-25 NOTE — Progress Notes (Signed)
   Covid-19 Vaccination Clinic  Name:  Sean Dean    MRN: [id number] DOB: Mar 02, 1981  09/25/2019  Mr. [name] was observed post Covid-19 immunization for 15 minutes without incident. He was provided with Vaccine Information Sheet and instruction to access the V-Safe system.   Mr. [name] was instructed to call 911 with any severe reactions post vaccine: Marland Kitchen Difficulty breathing  . Swelling of face and throat  . A fast heartbeat  . A bad rash all over body  . Dizziness and weakness   Immunizations Administered    Name Date Dose VIS Date Route   Pfizer COVID-19 Vaccine 09/25/2019  3:07 PM 0.3 mL 07/17/2018 Intramuscular   Manufacturer: [employer], [employer]   Lot: [id number]   NDC: [address]-2

## 2019-11-10 ENCOUNTER — Encounter: Payer: Self-pay | Admitting: Family Medicine

## 2019-11-11 ENCOUNTER — Telehealth: Payer: Self-pay

## 2019-11-11 DIAGNOSIS — E039 Hypothyroidism, unspecified: Secondary | ICD-10-CM

## 2019-11-11 MED ORDER — LEVOTHYROXINE SODIUM 150 MCG PO TABS: 150.0000 ug | ORAL_TABLET | Freq: Every day | ORAL | 0 refills | Status: DC

## 2019-11-11 NOTE — Telephone Encounter (Signed)
Sent 30 day supply of medication but patient will need office visit for any future refills.

## 2019-12-05 ENCOUNTER — Other Ambulatory Visit: Payer: Self-pay | Admitting: Family Medicine

## 2019-12-05 DIAGNOSIS — E039 Hypothyroidism, unspecified: Secondary | ICD-10-CM

## 2019-12-05 NOTE — Telephone Encounter (Signed)
Pt. Has appointment for August.

## 2019-12-23 NOTE — Progress Notes (Signed)
Established patient visit   Patient: Sean Dean   DOB: Sep 09, 1980   39 y.o. Male  MRN: [id number] Visit Date: 12/24/2019  Today's healthcare provider: Megan Mans, MD   Chief Complaint  Patient presents with  . Hypertension  . Hypothyroidism   Subjective    HPI  Patient feeling well.  He is married and has 2 stepchildren.  He is little stressed out and admits to drinking about a sixpack of beer per night and would like to have liver functions done in addition to his thyroid.  Symptomatically he has no complaints.  No abdominal pain and no depression or anxiety. Lipid/Cholesterol, follow-up  Last Lipid Panel: Lab Results  Component Value Date   CHOL 228 (H) 06/21/2019   LDLCALC 153 (H) 06/21/2019   HDL 60 06/21/2019   TRIG 84 06/21/2019    He was last seen for this 7 months ago.  Management since that visit includes; labs checked showing-okay except for mildly elevated cholesterol. Work on diet and exercise. He reports excellent compliance with treatment. He is not having side effects.   Last metabolic panel Lab Results  Component Value Date   GLUCOSE 91 06/21/2019   NA 140 06/21/2019   K 4.3 06/21/2019   BUN 12 06/21/2019   CREATININE 0.92 06/21/2019   GFRNONAA 104 06/21/2019   GFRAA 121 06/21/2019   CALCIUM 9.7 06/21/2019   AST 22 06/21/2019   ALT 28 06/21/2019   The 10-year ASCVD risk score Denman George DC Jr., et al., 2013) is: 1.2%  --------------------------------------------------------------------------------------------------- Hypothyroid, follow-up  Lab Results  Component Value Date   TSH 1.970 06/21/2019   TSH 1.870 06/14/2018   TSH 2.890 12/11/2017   Wt Readings from Last 3 Encounters:  12/24/19 231 lb (104.8 kg)  06/14/18 241 lb (109.3 kg)  12/11/17 239 lb (108.4 kg)   He was last seen for hypothyroid 7 months ago.  Management since that visit includes; labs checked showing-Thyroid dose is correct.  He reports excellent  compliance with treatment. He is not having side effects.  -----------------------------------------------------------------------------------------       Medications: Outpatient Medications Prior to Visit  Medication Sig  . levothyroxine (SYNTHROID) 150 MCG tablet TAKE 1 TABLET BY MOUTH EVERY DAY  . doxepin (SINEQUAN) 25 MG capsule Take 1 capsule (25 mg total) by mouth at bedtime. (Patient not taking: Reported on 12/11/2017)  . naproxen (NAPROSYN) 500 MG tablet Take 1 tablet (500 mg total) by mouth 2 (two) times daily with a meal.   No facility-administered medications prior to visit.    Review of Systems  Constitutional: Negative for fatigue and fever.  Respiratory: Negative for cough, shortness of breath and wheezing.   Cardiovascular: Negative for chest pain, palpitations and leg swelling.  Neurological: Negative for dizziness and headaches.       Objective    BP 132/78 (BP Location: Right Arm, Patient Position: Sitting, Cuff Size: Normal)   Pulse 69   Temp 98.1 F (36.7 C) (Oral)   Wt 231 lb (104.8 kg)   SpO2 97%   BMI 28.12 kg/m     Physical Exam Vitals reviewed.  Constitutional:      Appearance: He is well-developed.  HENT:     Head: Normocephalic and atraumatic.     Right Ear: External ear normal.     Left Ear: External ear normal.     Nose: Nose normal.  Eyes:     General: No scleral icterus.    Conjunctiva/sclera: Conjunctivae normal.  Neck:     Thyroid: No thyromegaly.  Cardiovascular:     Rate and Rhythm: Normal rate and regular rhythm.     Heart sounds: Normal heart sounds.  Pulmonary:     Effort: Pulmonary effort is normal.     Breath sounds: Normal breath sounds.  Abdominal:     Palpations: Abdomen is soft.  Lymphadenopathy:     Cervical: No cervical adenopathy.  Skin:    General: Skin is warm and dry.  Neurological:     General: No focal deficit present.     Mental Status: He is alert and oriented to person, place, and time.    Psychiatric:        Mood and Affect: Mood normal.        Behavior: Behavior normal.        Thought Content: Thought content normal.        Judgment: Judgment normal.       No results found for any visits on 12/24/19.  Assessment & Plan     1. Hypothyroidism, unspecified type On Synthroid 150 mcg daily - TSH  2. Alcohol use Patient advised to cut back.  We will also follow-up on this on his next visit.  PHQ-9 and GAD-7 on next visit. - Hepatic function panel   No follow-ups on file.         Maelle Sheaffer Wendelyn Breslow, MD  [facility] [phone] (phone) [phone] (fax)  [facility]

## 2019-12-24 ENCOUNTER — Other Ambulatory Visit: Payer: Self-pay

## 2019-12-24 ENCOUNTER — Ambulatory Visit (INDEPENDENT_AMBULATORY_CARE_PROVIDER_SITE_OTHER): Payer: BC Managed Care – PPO | Admitting: Family Medicine

## 2019-12-24 VITALS — BP 132/78 | HR 69 | Temp 98.1°F | Wt 231.0 lb

## 2019-12-24 DIAGNOSIS — E039 Hypothyroidism, unspecified: Secondary | ICD-10-CM

## 2019-12-24 DIAGNOSIS — Z789 Other specified health status: Secondary | ICD-10-CM

## 2019-12-24 DIAGNOSIS — Z7289 Other problems related to lifestyle: Secondary | ICD-10-CM | POA: Diagnosis not present

## 2019-12-25 ENCOUNTER — Other Ambulatory Visit: Payer: Self-pay

## 2019-12-25 DIAGNOSIS — E039 Hypothyroidism, unspecified: Secondary | ICD-10-CM

## 2019-12-25 LAB — TSH: TSH: 2.97 u[IU]/mL (ref 0.450–4.500)

## 2019-12-25 LAB — HEPATIC FUNCTION PANEL
ALT: 19 IU/L (ref 0–44)
AST: 16 IU/L (ref 0–40)
Albumin: 4.7 g/dL (ref 4.0–5.0)
Alkaline Phosphatase: 84 IU/L (ref 48–121)
Bilirubin Total: 0.8 mg/dL (ref 0.0–1.2)
Bilirubin, Direct: 0.23 mg/dL (ref 0.00–0.40)
Total Protein: 7.3 g/dL (ref 6.0–8.5)

## 2019-12-25 MED ORDER — LEVOTHYROXINE SODIUM 150 MCG PO TABS: 150.0000 ug | ORAL_TABLET | Freq: Every day | ORAL | 3 refills | Status: DC

## 2020-02-14 DIAGNOSIS — Z20822 Contact with and (suspected) exposure to covid-19: Secondary | ICD-10-CM | POA: Diagnosis not present

## 2020-08-03 ENCOUNTER — Telehealth: Payer: Self-pay

## 2020-08-03 NOTE — Telephone Encounter (Signed)
Copied from CRM [id number]. Topic: Appointment Scheduling - Scheduling Inquiry for Clinic >> Aug 03, 2020 12:44 PM Randol Kern wrote: Reason for CRM: Pt wants to know if he can keep his appt tomorrow but make it virtual, he is recovering from testing positive for covid.  Best contact: [phone]

## 2020-08-04 ENCOUNTER — Encounter: Payer: Self-pay | Admitting: Family Medicine

## 2020-08-04 NOTE — Telephone Encounter (Signed)
Rescheduled appt

## 2020-08-04 NOTE — Progress Notes (Deleted)
Complete physical exam   Patient: Sean Dean   DOB: 08-12-1980   39 y.o. Male  MRN: [id number] Visit Date: 08/04/2020  Today's healthcare provider: Megan Mans, MD   No chief complaint on file.  Subjective    Sean Dean is a 40 y.o. male who presents today for a complete physical exam.  He reports consuming a {diet types:17450} diet. {Exercise:19826} He generally feels {well/fairly well/poorly:18703}. He reports sleeping {well/fairly well/poorly:18703}. He {does/does not:200015} have additional problems to discuss today.  HPI  ***  Past Medical History:  Diagnosis Date  . GERD (gastroesophageal reflux disease)    Past Surgical History:  Procedure Laterality Date  . UPPER GI ENDOSCOPY  06/18/02   normal everything   Social History   Socioeconomic History  . Marital status: Married    Spouse name: Not on file  . Number of children: Not on file  . Years of education: Not on file  . Highest education level: Not on file  Occupational History  . Not on file  Tobacco Use  . Smoking status: Former Games developer  . Smokeless tobacco: Never Used  Substance and Sexual Activity  . Alcohol use: Yes    Alcohol/week: 30.0 standard drinks    Types: 30 Cans of beer per week    Comment: 3-4 drinks per day  . Drug use: No  . Sexual activity: Yes    Birth control/protection: None  Other Topics Concern  . Not on file  Social History Narrative  . Not on file   Social Determinants of Health   Financial Resource Strain: Not on file  Food Insecurity: Not on file  Transportation Needs: Not on file  Physical Activity: Not on file  Stress: Not on file  Social Connections: Not on file  Intimate Partner Violence: Not on file   Family Status  Relation Name Status  . Sister  Alive  . Mother  Alive  . Father  Alive  . MGM  Deceased  . MGF  (Not Specified)  . PGF  (Not Specified)   Family History  Problem Relation Age of Onset  . Epilepsy Sister   . Healthy  Mother   . Healthy Father   . Colon cancer Maternal Grandfather   . Prostate cancer Paternal Grandfather    No Known Allergies  Patient Care Team: Maple Hudson., MD as PCP - General (Family Medicine)   Medications: Outpatient Medications Prior to Visit  Medication Sig  . doxepin (SINEQUAN) 25 MG capsule Take 1 capsule (25 mg total) by mouth at bedtime. (Patient not taking: Reported on 12/11/2017)  . levothyroxine (SYNTHROID) 150 MCG tablet Take 1 tablet (150 mcg total) by mouth daily.  . naproxen (NAPROSYN) 500 MG tablet Take 1 tablet (500 mg total) by mouth 2 (two) times daily with a meal.   No facility-administered medications prior to visit.    Review of Systems  All other systems reviewed and are negative.   {Labs  Heme  Chem  Endocrine  Serology  Results Review (optional):23779::" "}  Objective    There were no vitals taken for this visit. {Show previous vital signs (optional):23777::" "}  Physical Exam  ***  Last depression screening scores PHQ 2/9 Scores 09/13/2017  PHQ - 2 Score 2  PHQ- 9 Score 9   Last fall risk screening No flowsheet data found. Last Audit-C alcohol use screening No flowsheet data found. A score of 3 or more in women, and 4 or  more in men indicates increased risk for alcohol abuse, EXCEPT if all of the points are from question 1   No results found for any visits on 08/04/20.  Assessment & Plan    Routine Health Maintenance and Physical Exam  Exercise Activities and Dietary recommendations Goals   None     Immunization History  Administered Date(s) Administered  . Influenza,inj,Quad PF,6+ Mos 05/13/2015  . PFIZER(Purple Top)SARS-COV-2 Vaccination 09/02/2019, 09/25/2019  . Tdap 03/31/2005, 05/06/2011, 05/13/2015    Health Maintenance  Topic Date Due  . Hepatitis C Screening  Never done  . HIV Screening  Never done  . COVID-19 Vaccine (3 - Pfizer risk 4-dose series) 10/23/2019  . INFLUENZA VACCINE  12/22/2019  .  TETANUS/TDAP  05/12/2025  . HPV VACCINES  Aged Out    Discussed health benefits of physical activity, and encouraged him to engage in regular exercise appropriate for his age and condition.  ***  No follow-ups on file.     {provider attestation***:1}   Megan Mans, MD  [facility] [phone] (phone) [phone] (fax)  [facility]

## 2020-08-13 ENCOUNTER — Encounter: Payer: Self-pay | Admitting: Family Medicine

## 2020-08-14 ENCOUNTER — Encounter: Payer: Self-pay | Admitting: Family Medicine

## 2020-08-14 ENCOUNTER — Telehealth (INDEPENDENT_AMBULATORY_CARE_PROVIDER_SITE_OTHER): Payer: BC Managed Care – PPO | Admitting: Family Medicine

## 2020-08-14 DIAGNOSIS — J40 Bronchitis, not specified as acute or chronic: Secondary | ICD-10-CM

## 2020-08-14 MED ORDER — AZITHROMYCIN 250 MG PO TABS
ORAL_TABLET | ORAL | 0 refills | Status: AC
Start: 2020-08-14 — End: 2020-08-19

## 2020-08-14 NOTE — Progress Notes (Signed)
MyChart Video Visit    Virtual Visit via Video Note   This visit type was conducted due to national recommendations for restrictions regarding the COVID-19 Pandemic (e.g. social distancing) in an effort to limit this patient's exposure and mitigate transmission in our community. This patient is at least at moderate risk for complications without adequate follow up. This format is felt to be most appropriate for this patient at this time. Physical exam was limited by quality of the video and audio technology used for the visit.   Patient location: home Provider location: bfp  I discussed the limitations of evaluation and management by telemedicine and the availability of in person appointments. The patient expressed understanding and agreed to proceed.  Patient: Sean Dean   DOB: 1980/05/30   39 y.o. Male  MRN: [id number] Visit Date: 08/14/2020  Today's healthcare provider: Mila Merry, MD   Chief Complaint  Patient presents with  . URI   Subjective    URI  This is a new problem. Episode onset: 4 days ago. The problem has been gradually worsening. There has been no fever. Associated symptoms include congestion (chest congestion and ear fullness), coughing (productive with brown sputum), ear pain, a plugged ear sensation, rhinorrhea and a sore throat (feels like something is stuck in his throat). Pertinent negatives include no abdominal pain, chest pain, nausea, vomiting or wheezing. Treatments tried: Mucinex and Ibuprofen. The treatment provided mild relief.    Patient reports that he had COVID 3 weeks ago. He states he felt like he fully recovered until symptoms returned 4 days ago.  Has been spitting up green mucousy phlegm.     Medications: Outpatient Medications Prior to Visit  Medication Sig  . esomeprazole (NEXIUM) 20 MG capsule Take 20 mg by mouth daily at 12 noon.  Marland Kitchen levothyroxine (SYNTHROID) 150 MCG tablet Take 1 tablet (150 mcg total) by mouth daily.  .  naproxen (NAPROSYN) 500 MG tablet Take 1 tablet (500 mg total) by mouth 2 (two) times daily with a meal. (Patient not taking: Reported on 08/14/2020)  . [DISCONTINUED] doxepin (SINEQUAN) 25 MG capsule Take 1 capsule (25 mg total) by mouth at bedtime. (Patient not taking: No sig reported)   No facility-administered medications prior to visit.    Review of Systems  Constitutional: Positive for fatigue. Negative for appetite change, chills and fever.  HENT: Positive for congestion (chest congestion and ear fullness), ear pain, rhinorrhea, sore throat (feels like something is stuck in his throat) and voice change.   Respiratory: Positive for cough (productive with brown sputum). Negative for chest tightness, shortness of breath and wheezing.   Cardiovascular: Negative for chest pain and palpitations.  Gastrointestinal: Negative for abdominal pain, nausea and vomiting.      Objective    There were no vitals taken for this visit.   Physical Exam   Awake, alert, oriented x 3. In no apparent distress     Assessment & Plan     1. Bronchitis  - azithromycin (ZITHROMAX) 250 MG tablet; 2 by mouth today, then 1 daily for 4 days  Dispense: 6 tablet; Refill: 0  Call if symptoms change or if not rapidly improving.      I discussed the assessment and treatment plan with the patient. The patient was provided an opportunity to ask questions and all were answered. The patient agreed with the plan and demonstrated an understanding of the instructions.   The patient was advised to call back or seek an in-person  evaluation if the symptoms worsen or if the condition fails to improve as anticipated.  I provided 9 minutes of non-face-to-face time during this encounter.  The entirety of the information documented in the History of Present Illness, Review of Systems and Physical Exam were personally obtained by me. Portions of this information were initially documented by the CMA and reviewed by me for  thoroughness and accuracy.     Mila Merry, MD [facility] [phone] (phone) [phone] (fax)  [facility]

## 2020-09-14 ENCOUNTER — Other Ambulatory Visit: Payer: Self-pay

## 2020-09-14 ENCOUNTER — Ambulatory Visit (INDEPENDENT_AMBULATORY_CARE_PROVIDER_SITE_OTHER): Payer: BC Managed Care – PPO | Admitting: Family Medicine

## 2020-09-14 ENCOUNTER — Encounter: Payer: Self-pay | Admitting: Family Medicine

## 2020-09-14 VITALS — BP 144/80 | HR 85 | Temp 98.6°F | Resp 16 | Ht 76.0 in | Wt 230.0 lb

## 2020-09-14 DIAGNOSIS — Z0000 Encounter for general adult medical examination without abnormal findings: Secondary | ICD-10-CM

## 2020-09-14 DIAGNOSIS — E039 Hypothyroidism, unspecified: Secondary | ICD-10-CM | POA: Diagnosis not present

## 2020-09-14 DIAGNOSIS — E7800 Pure hypercholesterolemia, unspecified: Secondary | ICD-10-CM

## 2020-09-14 DIAGNOSIS — R0683 Snoring: Secondary | ICD-10-CM

## 2020-09-14 DIAGNOSIS — F5102 Adjustment insomnia: Secondary | ICD-10-CM

## 2020-09-14 MED ORDER — LORAZEPAM 0.5 MG PO TABS: 0.5000 mg | ORAL_TABLET | Freq: Every evening | ORAL | 3 refills | Status: DC | PRN

## 2020-09-14 NOTE — Patient Instructions (Signed)
Call Nathan Blake @ [phone] 

## 2020-09-14 NOTE — Progress Notes (Signed)
Complete physical exam   Patient: Sean Dean   DOB: 08-12-1980   39 y.o. Male  MRN: [id number] Visit Date: 09/14/2020  Today's healthcare provider: Megan Mans, MD   Chief Complaint  Patient presents with  . Annual Exam   Subjective    Sean Dean is a 41 y.o. male who presents today for a complete physical exam.  He reports consuming a general diet. The patient does not participate in regular exercise at present. He generally feels fairly well. He reports sleeping poorly. He does have additional problems to discuss today.   Patient also mentions that he is going through a divorce, and he is having trouble sleeping. He is requesting something to help with this. He averages about 3hrs nightly. He has had symptoms for the last 10 days. Has tried nothing over the counter.  He is not suicidal or homicidal but has both terminal and initial insomnia He is separated from with his wife and he is really torn up about this. He has had 2 COVID vaccines and he did have COVID in February 2022.  Past Medical History:  Diagnosis Date  . GERD (gastroesophageal reflux disease)    Past Surgical History:  Procedure Laterality Date  . UPPER GI ENDOSCOPY  06/18/02   normal everything   Social History   Socioeconomic History  . Marital status: Married    Spouse name: Not on file  . Number of children: Not on file  . Years of education: Not on file  . Highest education level: Not on file  Occupational History  . Not on file  Tobacco Use  . Smoking status: Former Games developer  . Smokeless tobacco: Never Used  Substance and Sexual Activity  . Alcohol use: Yes    Alcohol/week: 30.0 standard drinks    Types: 30 Cans of beer per week    Comment: 3-4 drinks per day  . Drug use: No  . Sexual activity: Yes    Birth control/protection: None  Other Topics Concern  . Not on file  Social History Narrative  . Not on file   Social Determinants of Health   Financial Resource  Strain: Not on file  Food Insecurity: Not on file  Transportation Needs: Not on file  Physical Activity: Not on file  Stress: Not on file  Social Connections: Not on file  Intimate Partner Violence: Not on file   Family Status  Relation Name Status  . Sister  Alive  . Mother  Alive  . Father  Alive  . MGM  Deceased  . MGF  (Not Specified)  . PGF  (Not Specified)   Family History  Problem Relation Age of Onset  . Epilepsy Sister   . Healthy Mother   . Healthy Father   . Colon cancer Maternal Grandfather   . Prostate cancer Paternal Grandfather    No Known Allergies  Patient Care Team: Maple Hudson., MD as PCP - General (Family Medicine)   Medications: Outpatient Medications Prior to Visit  Medication Sig  . levothyroxine (SYNTHROID) 150 MCG tablet Take 1 tablet (150 mcg total) by mouth daily.  Marland Kitchen esomeprazole (NEXIUM) 20 MG capsule Take 20 mg by mouth daily at 12 noon. (Patient not taking: Reported on 09/14/2020)  . naproxen (NAPROSYN) 500 MG tablet Take 1 tablet (500 mg total) by mouth 2 (two) times daily with a meal. (Patient not taking: No sig reported)   No facility-administered medications prior to visit.  Review of Systems  All other systems reviewed and are negative.      Objective    BP (!) 144/80   Pulse 85   Temp 98.6 F (37 C)   Resp 16   Ht 6\' 4"  (1.93 m)   Wt 230 lb (104.3 kg)   BMI 28.00 kg/m  BP Readings from Last 3 Encounters:  09/14/20 (!) 144/80  12/24/19 132/78  06/14/18 132/86   Wt Readings from Last 3 Encounters:  09/14/20 230 lb (104.3 kg)  12/24/19 231 lb (104.8 kg)  06/14/18 241 lb (109.3 kg)      Physical Exam Vitals reviewed.  Constitutional:      Appearance: He is well-developed.  HENT:     Head: Normocephalic.     Right Ear: External ear normal.     Left Ear: External ear normal.     Nose: Nose normal.  Eyes:     Conjunctiva/sclera: Conjunctivae normal.     Pupils: Pupils are equal, round, and reactive  to light.  Cardiovascular:     Rate and Rhythm: Normal rate and regular rhythm.     Heart sounds: Normal heart sounds.  Pulmonary:     Effort: Pulmonary effort is normal.     Breath sounds: Normal breath sounds.  Abdominal:     Palpations: Abdomen is soft.  Genitourinary:    Penis: Normal.      Testes: Normal.  Musculoskeletal:     Cervical back: Neck supple.  Skin:    General: Skin is warm and dry.     Comments: Multiple benign nevi on trunk  Neurological:     General: No focal deficit present.     Mental Status: He is alert and oriented to person, place, and time.  Psychiatric:        Behavior: Behavior normal.        Thought Content: Thought content normal.        Judgment: Judgment normal.       Last depression screening scores PHQ 2/9 Scores 09/14/2020 09/13/2017  PHQ - 2 Score 3 2  PHQ- 9 Score 11 9   Last fall risk screening Fall Risk  09/14/2020  Falls in the past year? 0  Number falls in past yr: 0  Injury with Fall? 0  Risk for fall due to : No Fall Risks  Follow up Falls evaluation completed   Last Audit-C alcohol use screening Alcohol Use Disorder Test (AUDIT) 09/14/2020  1. How often do you have a drink containing alcohol? 4  2. How many drinks containing alcohol do you have on a typical day when you are drinking? 1  3. How often do you have six or more drinks on one occasion? 2  AUDIT-C Score 7  4. How often during the last year have you found that you were not able to stop drinking once you had started? 0  5. How often during the last year have you failed to do what was normally expected from you because of drinking? 0  6. How often during the last year have you needed a first drink in the morning to get yourself going after a heavy drinking session? 0  7. How often during the last year have you had a feeling of guilt of remorse after drinking? 0  8. How often during the last year have you been unable to remember what happened the night before because you  had been drinking? 0  9. Have you or someone else been injured  as a result of your drinking? 0  10. Has a relative or friend or a doctor or another health worker been concerned about your drinking or suggested you cut down? 0  Alcohol Use Disorder Identification Test Final Score (AUDIT) 7   A score of 3 or more in women, and 4 or more in men indicates increased risk for alcohol abuse, EXCEPT if all of the points are from question 1   No results found for any visits on 09/14/20.  Assessment & Plan    Routine Health Maintenance and Physical Exam  Exercise Activities and Dietary recommendations Goals   None     Immunization History  Administered Date(s) Administered  . Influenza,inj,Quad PF,6+ Mos 05/13/2015  . PFIZER(Purple Top)SARS-COV-2 Vaccination 09/02/2019, 09/25/2019  . Tdap 03/31/2005, 05/06/2011, 05/13/2015    Health Maintenance  Topic Date Due  . Hepatitis C Screening  Never done  . HIV Screening  Never done  . COVID-19 Vaccine (3 - Pfizer risk 4-dose series) 10/23/2019  . INFLUENZA VACCINE  12/21/2020  . TETANUS/TDAP  05/12/2025  . HPV VACCINES  Aged Out    Discussed health benefits of physical activity, and encouraged him to engage in regular exercise appropriate for his age and condition.  1. Annual physical exam Chart now with PSA screening - CBC w/Diff/Platelet  2. Pure hypercholesterolemia  - Lipid panel - Comprehensive Metabolic Panel (CMET)  3. Adult hypothyroidism  - TSH  4. Adjustment insomnia 4 adjustment reaction with the stress of separation try lorazepam nightly as needed for sleep a couple of times a week.  Also refer to Ramond Dial for counseling. - LORazepam (ATIVAN) 0.5 MG tablet; Take 1 tablet (0.5 mg total) by mouth at bedtime as needed for sleep.  Dispense: 30 tablet; Refill: 3  5. Snoring She may have early sleep apnea.  Epworth is obtained.  Diet and exercise and get down to an ideal body weight.  He probably needs to lose about 15  pounds.   No follow-ups on file.     I, Megan Mans, MD, have reviewed all documentation for this visit. The documentation on 09/19/20 for the exam, diagnosis, procedures, and orders are all accurate and complete.    Everline Mahaffy Wendelyn Breslow, MD  [facility] [phone] (phone) [phone] (fax)  [facility]

## 2020-09-15 ENCOUNTER — Other Ambulatory Visit: Payer: Self-pay | Admitting: Family Medicine

## 2020-09-15 ENCOUNTER — Other Ambulatory Visit: Payer: Self-pay

## 2020-09-15 DIAGNOSIS — F5102 Adjustment insomnia: Secondary | ICD-10-CM

## 2020-09-15 LAB — CBC WITH DIFFERENTIAL/PLATELET
Basophils Absolute: 0 10*3/uL (ref 0.0–0.2)
Basos: 0 %
EOS (ABSOLUTE): 0.2 10*3/uL (ref 0.0–0.4)
Eos: 2 %
Hematocrit: 45.1 % (ref 37.5–51.0)
Hemoglobin: 15.3 g/dL (ref 13.0–17.7)
Immature Grans (Abs): 0.1 10*3/uL (ref 0.0–0.1)
Immature Granulocytes: 1 %
Lymphocytes Absolute: 1.7 10*3/uL (ref 0.7–3.1)
Lymphs: 16 %
MCH: 30.7 pg (ref 26.6–33.0)
MCHC: 33.9 g/dL (ref 31.5–35.7)
MCV: 90 fL (ref 79–97)
Monocytes Absolute: 0.8 10*3/uL (ref 0.1–0.9)
Monocytes: 7 %
Neutrophils Absolute: 7.8 10*3/uL — ABNORMAL HIGH (ref 1.4–7.0)
Neutrophils: 74 %
Platelets: 233 10*3/uL (ref 150–450)
RBC: 4.99 x10E6/uL (ref 4.14–5.80)
RDW: 12.3 % (ref 11.6–15.4)
WBC: 10.5 10*3/uL (ref 3.4–10.8)

## 2020-09-15 LAB — LIPID PANEL
Chol/HDL Ratio: 3.7 ratio (ref 0.0–5.0)
Cholesterol, Total: 254 mg/dL — ABNORMAL HIGH (ref 100–199)
HDL: 69 mg/dL (ref >39)
LDL Chol Calc (NIH): 160 mg/dL — ABNORMAL HIGH (ref 0–99)
Triglycerides: 140 mg/dL (ref 0–149)
VLDL Cholesterol Cal: 25 mg/dL (ref 5–40)

## 2020-09-15 LAB — COMPREHENSIVE METABOLIC PANEL
ALT: 19 IU/L (ref 0–44)
AST: 15 IU/L (ref 0–40)
Albumin/Globulin Ratio: 1.9 (ref 1.2–2.2)
Albumin: 5.2 g/dL — ABNORMAL HIGH (ref 4.0–5.0)
Alkaline Phosphatase: 101 IU/L (ref 44–121)
BUN/Creatinine Ratio: 14 (ref 9–20)
BUN: 15 mg/dL (ref 6–24)
Bilirubin Total: 0.6 mg/dL (ref 0.0–1.2)
CO2: 22 mmol/L (ref 20–29)
Calcium: 10.2 mg/dL (ref 8.7–10.2)
Chloride: 98 mmol/L (ref 96–106)
Creatinine, Ser: 1.07 mg/dL (ref 0.76–1.27)
Globulin, Total: 2.8 g/dL (ref 1.5–4.5)
Glucose: 94 mg/dL (ref 65–99)
Potassium: 4.4 mmol/L (ref 3.5–5.2)
Sodium: 139 mmol/L (ref 134–144)
Total Protein: 8 g/dL (ref 6.0–8.5)
eGFR: 90 mL/min/{1.73_m2} (ref >59)

## 2020-09-15 LAB — TSH: TSH: 2.81 u[IU]/mL (ref 0.450–4.500)

## 2020-09-15 MED ORDER — LORAZEPAM 0.5 MG PO TABS: 0.5000 mg | ORAL_TABLET | Freq: Every evening | ORAL | 3 refills | Status: DC | PRN

## 2020-09-15 NOTE — Telephone Encounter (Signed)
Requested medication (s) are due for refill today: requested to resend to pharmacy   Requested medication (s) are on the active medication list: yes  Last refill:  09/15/20 #30 3 refills  Future visit scheduled: yes in 3 months , seen yesterday  Notes to clinic:  not delegated per protocol     Requested Prescriptions  Pending Prescriptions Disp Refills   LORazepam (ATIVAN) 0.5 MG tablet 30 tablet 3    Sig: Take 1 tablet (0.5 mg total) by mouth at bedtime as needed for sleep.      Not Delegated - Psychiatry:  Anxiolytics/Hypnotics Failed - 09/15/2020  4:32 PM      Failed - This refill cannot be delegated      Failed - Urine Drug Screen completed in last 360 days      Passed - Valid encounter within last 6 months    Recent Outpatient Visits           Yesterday Annual physical exam   [facility] Maple Hudson., MD   1 month ago Bronchitis   [facility] Malva Limes, MD   8 months ago Hypothyroidism, unspecified type   [facility] Maple Hudson., MD   1 year ago Pure hypercholesterolemia   [facility] Maple Hudson., MD   2 years ago Adult hypothyroidism   [facility] Maple Hudson., MD       Future Appointments             In 3 months Maple Hudson., MD [facility]

## 2020-09-15 NOTE — Telephone Encounter (Signed)
Pt called reporting that he will call office back if Rx is not available around 4:30 pm. Advised to check contact pharmacy first   [facility] [address] [place] - [address]  [address] [place] [address]  Phone: [phone] Fax: [phone]

## 2020-09-15 NOTE — Telephone Encounter (Signed)
During his OV on 09/14/20 this was to be sent to his pharmacy.  It appears it was printed instead of sent in.  Could you please send it.  I have changed the route so it will go to the pharmacy. Thanks

## 2020-09-15 NOTE — Telephone Encounter (Signed)
Copied from CRM [id number]. Topic: Quick Communication - Rx Refill/Question >> Sep 15, 2020  4:25 PM Marylen Ponto wrote: Pt stated his pharmacy has not received Rx. The Rx shows (Class: Print)  Medication: LORazepam (ATIVAN) 0.5 MG tablet  Has the patient contacted their pharmacy? yes   Preferred Pharmacy (with phone number or street name): [id number] [place], [place] - [address]  Phone: [phone]    Fax: [phone]  Agent: Please be advised that RX refills may take up to 3 business days. We ask that you follow-up with your pharmacy.

## 2020-09-16 MED ORDER — LORAZEPAM 0.5 MG PO TABS: 0.5000 mg | ORAL_TABLET | Freq: Every evening | ORAL | 3 refills | Status: DC | PRN

## 2020-09-16 MED ORDER — LORAZEPAM 0.5 MG PO TABS
0.5000 mg | ORAL_TABLET | Freq: Every evening | ORAL | 3 refills | Status: DC | PRN
Start: 2020-09-16 — End: 2020-09-16

## 2020-12-29 ENCOUNTER — Telehealth (INDEPENDENT_AMBULATORY_CARE_PROVIDER_SITE_OTHER): Payer: BC Managed Care – PPO | Admitting: Family Medicine

## 2020-12-29 ENCOUNTER — Encounter: Payer: Self-pay | Admitting: Family Medicine

## 2020-12-29 ENCOUNTER — Ambulatory Visit: Payer: Self-pay | Admitting: Family Medicine

## 2020-12-29 DIAGNOSIS — E7800 Pure hypercholesterolemia, unspecified: Secondary | ICD-10-CM | POA: Diagnosis not present

## 2020-12-29 DIAGNOSIS — E039 Hypothyroidism, unspecified: Secondary | ICD-10-CM | POA: Diagnosis not present

## 2020-12-29 NOTE — Progress Notes (Signed)
MyChart Video Visit    Virtual Visit via Video Note   This visit type was conducted due to national recommendations for restrictions regarding the COVID-19 Pandemic (e.g. social distancing) in an effort to limit this patient's exposure and mitigate transmission in our community. This patient is at least at moderate risk for complications without adequate follow up. This format is felt to be most appropriate for this patient at this time. Physical exam was limited by quality of the video and audio technology used for the visit.   Patient location: Patient's place of work Provider location: My office  I discussed the limitations of evaluation and management by telemedicine and the availability of in person appointments. The patient expressed understanding and agreed to proceed.  Patient: Sean Dean   DOB: Feb 04, 1981   40 y.o. Male  MRN: [id number] Visit Date: 12/29/2020  Today's healthcare provider: Megan Mans, MD   No chief complaint on file.  Subjective    HPI  Patient comes in today for follow-up of his hypothyroid and hyperlipidemia.  He is feeling fairly well.  His marriage is doing better and he has now moved to [place] full-time.  He has been working in [place] but has been commuting. Follow up for insomnia  The patient was last seen for this 3 months ago. Changes made at last visit include starting Lorazepam at bedtime as needed.  He reports good compliance with treatment. He feels that condition is Improved. He is not having side effects.       Medications: Outpatient Medications Prior to Visit  Medication Sig   levothyroxine (SYNTHROID) 150 MCG tablet Take 1 tablet (150 mcg total) by mouth daily.   LORazepam (ATIVAN) 0.5 MG tablet Take 1 tablet (0.5 mg total) by mouth at bedtime as needed for sleep.   [DISCONTINUED] esomeprazole (NEXIUM) 20 MG capsule Take 20 mg by mouth daily at 12 noon. (Patient not taking: No sig reported)   [DISCONTINUED]  naproxen (NAPROSYN) 500 MG tablet Take 1 tablet (500 mg total) by mouth 2 (two) times daily with a meal. (Patient not taking: No sig reported)   No facility-administered medications prior to visit.    Review of Systems  Constitutional:  Negative for appetite change, chills and fever.  Respiratory:  Negative for chest tightness, shortness of breath and wheezing.   Cardiovascular:  Negative for chest pain and palpitations.  Gastrointestinal:  Negative for abdominal pain, nausea and vomiting.      Objective    There were no vitals taken for this visit. BP Readings from Last 3 Encounters:  09/14/20 (!) 144/80  12/24/19 132/78  06/14/18 132/86   Wt Readings from Last 3 Encounters:  09/14/20 230 lb (104.3 kg)  12/24/19 231 lb (104.8 kg)  06/14/18 241 lb (109.3 kg)      Physical Exam     Assessment & Plan     1. Pure hypercholesterolemia Lipid profile ordered.  Patient is going to find an primary care doctor in [place] and follow-up going forward.  We will be available until this happens. - Lipid Profile  2. Hypothyroidism, unspecified type Longstanding treatment with levothyroxine.  He is tolerating well and will follow-up with TSH with lab draw. - TSH   No follow-ups on file.     I discussed the assessment and treatment plan with the patient. The patient was provided an opportunity to ask questions and all were answered. The patient agreed with the plan and demonstrated an understanding of the instructions.  The patient was advised to call back or seek an in-person evaluation if the symptoms worsen or if the condition fails to improve as anticipated.  I provided 11 minutes of non-face-to-face time during this encounter.  I, Megan Mans, MD, have reviewed all documentation for this visit. The documentation on 12/31/20 for the exam, diagnosis, procedures, and orders are all accurate and complete.   Pammie Chirino Wendelyn Breslow, MD [facility] [phone]  (phone) [phone] (fax)  [facility]

## 2021-01-04 DIAGNOSIS — E7800 Pure hypercholesterolemia, unspecified: Secondary | ICD-10-CM | POA: Diagnosis not present

## 2021-01-04 DIAGNOSIS — E039 Hypothyroidism, unspecified: Secondary | ICD-10-CM | POA: Diagnosis not present

## 2021-01-05 LAB — LIPID PANEL
Chol/HDL Ratio: 3.9 ratio (ref 0.0–5.0)
Cholesterol, Total: 229 mg/dL — ABNORMAL HIGH (ref 100–199)
HDL: 59 mg/dL (ref >39)
LDL Chol Calc (NIH): 147 mg/dL — ABNORMAL HIGH (ref 0–99)
Triglycerides: 127 mg/dL (ref 0–149)
VLDL Cholesterol Cal: 23 mg/dL (ref 5–40)

## 2021-01-05 LAB — TSH: TSH: 1.64 u[IU]/mL (ref 0.450–4.500)

## 2021-03-01 ENCOUNTER — Other Ambulatory Visit: Payer: Self-pay | Admitting: Family Medicine

## 2021-03-01 DIAGNOSIS — E039 Hypothyroidism, unspecified: Secondary | ICD-10-CM

## 2021-03-01 NOTE — Telephone Encounter (Signed)
Medication Refill - Medication: Levothyroxine  Has the patient contacted their pharmacy? No. Pt states that the prescription has expired. Please advise.   (Agent: If no, request that the patient contact the pharmacy for the refill.) (Agent: If yes, when and what did the pharmacy advise?)  Preferred Pharmacy (with phone number or street name):  [id number] [place] [place] - [address] AT [facility] [address]  [address] [facility] [place] [address]  Phone: [phone] Fax: [phone]  Hours: Open 24 hours   Has the patient been seen for an appointment in the last year OR does the patient have an upcoming appointment? Yes.    Agent: Please be advised that RX refills may take up to 3 business days. We ask that you follow-up with your pharmacy.

## 2021-03-02 MED ORDER — LEVOTHYROXINE SODIUM 150 MCG PO TABS
150.0000 ug | ORAL_TABLET | Freq: Every day | ORAL | 3 refills | Status: DC
Start: 2021-03-02 — End: 2022-04-22

## 2021-04-14 DIAGNOSIS — R051 Acute cough: Secondary | ICD-10-CM | POA: Diagnosis not present

## 2021-04-14 DIAGNOSIS — J069 Acute upper respiratory infection, unspecified: Secondary | ICD-10-CM | POA: Diagnosis not present

## 2021-04-14 DIAGNOSIS — Z20822 Contact with and (suspected) exposure to covid-19: Secondary | ICD-10-CM | POA: Diagnosis not present

## 2021-04-14 DIAGNOSIS — R0981 Nasal congestion: Secondary | ICD-10-CM | POA: Diagnosis not present

## 2021-04-23 DIAGNOSIS — H52223 Regular astigmatism, bilateral: Secondary | ICD-10-CM | POA: Diagnosis not present

## 2021-04-23 DIAGNOSIS — H0288A Meibomian gland dysfunction right eye, upper and lower eyelids: Secondary | ICD-10-CM | POA: Diagnosis not present

## 2021-04-23 DIAGNOSIS — H16213 Exposure keratoconjunctivitis, bilateral: Secondary | ICD-10-CM | POA: Diagnosis not present

## 2021-04-23 DIAGNOSIS — H0288B Meibomian gland dysfunction left eye, upper and lower eyelids: Secondary | ICD-10-CM | POA: Diagnosis not present

## 2021-06-02 DIAGNOSIS — Z20822 Contact with and (suspected) exposure to covid-19: Secondary | ICD-10-CM | POA: Diagnosis not present

## 2021-06-07 DIAGNOSIS — Z20822 Contact with and (suspected) exposure to covid-19: Secondary | ICD-10-CM | POA: Diagnosis not present

## 2021-06-08 ENCOUNTER — Ambulatory Visit: Payer: Self-pay | Admitting: *Deleted

## 2021-06-08 NOTE — Telephone Encounter (Signed)
° °  Chief Complaint: requesting appt so he can go back to work and still testing positive for covid 15 days post covid  Symptoms: anxious he can not return to work  Frequency: since test positive again for covid  Pertinent Negatives: Patient denies severe anxiety  Disposition: [] ED /[] Urgent Care (no appt availability in office) / [x] Appointment(In office/virtual)/ []  [facility]/ [] Home Care/ [] Refused Recommended Disposition /[] [facility]/ []  Follow-up with PCP Additional Notes:  Appt scheduled for 06/09/21.       Reason for Disposition  MILD anxiety symptoms (e.g., Anxiety symptoms are mild and intermittent; symptoms do not interfere with daily activities)  Answer Assessment - Initial Assessment Questions 1. CONCERN: "Did anything happen that prompted you to call today?"      Requesting appt due to testing positive for covid and not being able to return to work. Causing anxiety due to amount of work piling up  2. ANXIETY SYMPTOMS: "Can you describe how you (your loved one; patient) have been feeling?" (e.g., tense, restless, panicky, anxious, keyed up, overwhelmed, sense of impending doom).      Anxious  3. ONSET: "How long have you been feeling this way?" (e.g., hours, days, weeks)     Since being out of work due to covid positive  4. SEVERITY: "How would you rate the level of anxiety?" (e.g., 0 - 10; or mild, moderate, severe).     Just anxious due to not being able to return to work 5. FUNCTIONAL IMPAIRMENT: "How have these feelings affected your ability to do daily activities?" "Have you had more difficulty than usual doing your normal daily activities?" (e.g., getting better, same, worse; self-care, school, work, interactions)     Can do ADL but not return to work due to positive covid test after 15 days  6. HISTORY: "Have you felt this way before?" "Have you ever been diagnosed with an anxiety problem in the past?" (e.g., generalized anxiety disorder, panic  attacks, PTSD). If Yes, ask: "How was this problem treated?" (e.g., medicines, counseling, etc.)     na 7. RISK OF HARM - SUICIDAL IDEATION: "Do you ever have thoughts of hurting or killing yourself?" If Yes, ask:  "Do you have these feelings now?" "Do you have a plan on how you would do this?"     na 8. TREATMENT:  "What has been done so far to treat this anxiety?" (e.g., medicines, relaxation strategies). "What has helped?"     na 9. TREATMENT - THERAPIST: "Do you have a counselor or therapist? Name?"     na 10. POTENTIAL TRIGGERS: "Do you drink caffeinated beverages (e.g., coffee, colas, teas), and how much daily?" "Do you drink alcohol or use any drugs?" "Have you started any new medicines recently?"     na 10. PATIENT SUPPORT: "Who is with you now?" "Who do you live with?" "Do you have family or friends who you can talk to?"        na 11. OTHER SYMPTOMS: "Do you have any other symptoms?" (e.g., feeling depressed, trouble concentrating, trouble sleeping, trouble breathing, palpitations or fast heartbeat, chest pain, sweating, nausea, or diarrhea)       na 12. PREGNANCY: "Is there any chance you are pregnant?" "When was your last menstrual period?"       na  Protocols used: Anxiety and Panic Attack-A-AH

## 2021-06-09 ENCOUNTER — Other Ambulatory Visit: Payer: Self-pay

## 2021-06-09 ENCOUNTER — Encounter: Payer: Self-pay | Admitting: Family Medicine

## 2021-06-09 ENCOUNTER — Ambulatory Visit (INDEPENDENT_AMBULATORY_CARE_PROVIDER_SITE_OTHER): Payer: BC Managed Care – PPO | Admitting: Family Medicine

## 2021-06-09 VITALS — BP 135/83 | HR 56 | Temp 98.3°F | Resp 16 | Ht 76.0 in | Wt 229.0 lb

## 2021-06-09 DIAGNOSIS — U071 COVID-19: Secondary | ICD-10-CM

## 2021-06-09 NOTE — Progress Notes (Signed)
° °  SUBJECTIVE:   CHIEF COMPLAINT / HPI:   Recent COVID - 05/24/21 symptom onset - fever, chills, headache. Resolved few days later. - denies trouble breathing, chest pain, fevers.  - works at [employer] of [employer], needs letter to be able to return to work given repeat positive COVID test.  OBJECTIVE:   BP 135/83 (BP Location: Right Arm, Cuff Size: Large)    Pulse (!) 56    Temp 98.3 F (36.8 C) (Temporal)    Resp 16    Ht 6\' 4"  (1.93 m)    Wt 229 lb (103.9 kg)    SpO2 98%    BMI 27.87 kg/m   Gen: well appearing, in NAD Card: Reg rate Lungs: Comfortable WOB on RA Ext: WWP, no edema   ASSESSMENT/PLAN:   COVID-19 Originally with mild symptoms and no longer symptomatic, afebrile. Outside of quarantine window, may return to work. Letter provided.    Myles Gip, DO

## 2021-08-24 DIAGNOSIS — D485 Neoplasm of uncertain behavior of skin: Secondary | ICD-10-CM | POA: Diagnosis not present

## 2021-08-24 DIAGNOSIS — B360 Pityriasis versicolor: Secondary | ICD-10-CM | POA: Diagnosis not present

## 2021-08-24 DIAGNOSIS — L814 Other melanin hyperpigmentation: Secondary | ICD-10-CM | POA: Diagnosis not present

## 2021-08-24 DIAGNOSIS — D225 Melanocytic nevi of trunk: Secondary | ICD-10-CM | POA: Diagnosis not present

## 2022-03-09 ENCOUNTER — Telehealth (INDEPENDENT_AMBULATORY_CARE_PROVIDER_SITE_OTHER): Payer: BC Managed Care – PPO | Admitting: Family Medicine

## 2022-03-09 DIAGNOSIS — J0110 Acute frontal sinusitis, unspecified: Secondary | ICD-10-CM | POA: Diagnosis not present

## 2022-03-09 DIAGNOSIS — R052 Subacute cough: Secondary | ICD-10-CM | POA: Diagnosis not present

## 2022-03-09 DIAGNOSIS — J301 Allergic rhinitis due to pollen: Secondary | ICD-10-CM | POA: Diagnosis not present

## 2022-03-09 MED ORDER — FLUTICASONE PROPIONATE 50 MCG/ACT NA SUSP: 2.0000 | Freq: Every day | NASAL | 0 refills | Status: DC

## 2022-03-09 MED ORDER — AZITHROMYCIN 250 MG PO TABS
ORAL_TABLET | ORAL | 0 refills | Status: AC
Start: 2022-03-09 — End: 2022-03-14

## 2022-03-09 NOTE — Progress Notes (Signed)
MyChart Video Visit    Virtual Visit via Video Note   This format is felt to be most appropriate for this patient at this time. Physical exam was limited by quality of the video and audio technology used for the visit.   Patient location: Home Provider location: [facility]  I discussed the limitations of evaluation and management by telemedicine and the availability of in person appointments. The patient expressed understanding and agreed to proceed.  Patient: Sean Dean   DOB: [birth year]   41 y.o. Male  MRN: [id number] Visit Date: 03/09/2022  Today's healthcare provider: Lelon Huh, MD   Chief Complaint  Patient presents with   Cough   Subjective    Cough This is a new problem. Episode onset: 2 weeks ago. The problem has been unchanged. The cough is Productive of sputum (green/ yellow colored). Associated symptoms include ear congestion (both ears), headaches, nasal congestion, postnasal drip, rhinorrhea and a sore throat. Pertinent negatives include no chest pain, chills, fever, hemoptysis, myalgias, shortness of breath, sweats or wheezing. Treatments tried: DayQuil and NyQuil. The treatment provided no relief.     Per patient report, home COVID test was performed 2 weeks ago and was negative.    Medications: Outpatient Medications Prior to Visit  Medication Sig   levothyroxine (SYNTHROID) 150 MCG tablet Take 1 tablet (150 mcg total) by mouth daily.   LORazepam (ATIVAN) 0.5 MG tablet Take 1 tablet (0.5 mg total) by mouth at bedtime as needed for sleep. (Patient not taking: Reported on 03/09/2022)   No facility-administered medications prior to visit.    Review of Systems  Constitutional:  Positive for fatigue. Negative for appetite change, chills and fever.  HENT:  Positive for congestion (chest and head congestion), postnasal drip, rhinorrhea and sore throat. Negative for sneezing.   Respiratory:  Positive for cough. Negative for hemoptysis, chest tightness,  shortness of breath and wheezing.   Cardiovascular:  Negative for chest pain and palpitations.  Gastrointestinal:  Negative for abdominal pain, nausea and vomiting.  Musculoskeletal:  Negative for myalgias.  Neurological:  Positive for headaches.       Objective    There were no vitals taken for this visit.     Physical Exam   Awake, alert, oriented x 3. In no apparent distress   Assessment & Plan     1. Subacute cough Likely post nasal drainage from allergies or sinusitis.   2. Allergic rhinitis due to pollen, unspecified seasonality  - fluticasone (FLONASE) 50 MCG/ACT nasal spray; Place 2 sprays into both nostrils daily.  Dispense: 16 g; Refill: 0  3. Acute non-recurrent frontal sinusitis  - azithromycin (ZITHROMAX) 250 MG tablet; Take 2 tablets on day 1, then 1 tablet daily on days 2 through 5  Dispense: 6 tablet; Refill: 0  Call if symptoms change or if not rapidly improving.       I discussed the assessment and treatment plan with the patient. The patient was provided an opportunity to ask questions and all were answered. The patient agreed with the plan and demonstrated an understanding of the instructions.   The patient was advised to call back or seek an in-person evaluation if the symptoms worsen or if the condition fails to improve as anticipated.  I provided 10 minutes of non-face-to-face time during this encounter.  The entirety of the information documented in the History of Present Illness, Review of Systems and Physical Exam were personally obtained by me. Portions of this information were initially  documented by the CMA and reviewed by me for thoroughness and accuracy.    Mila Merry, MD [facility] [phone] (phone) [phone] (fax)  [facility]

## 2022-04-21 ENCOUNTER — Other Ambulatory Visit: Payer: Self-pay | Admitting: Family Medicine

## 2022-04-21 DIAGNOSIS — E039 Hypothyroidism, unspecified: Secondary | ICD-10-CM

## 2022-04-21 NOTE — Telephone Encounter (Signed)
Medication Refill - Medication:  levothyroxine (SYNTHROID) 150 MCG tablet   Has the patient contacted their pharmacy? No.   Preferred Pharmacy (with phone number or street name):  [id number] - [place], [place] - [address] Phone: [phone]  Fax: [phone]     Has the patient been seen for an appointment in the last year OR does the patient have an upcoming appointment? Yes.    Agent: Please be advised that RX refills may take up to 3 business days. We ask that you follow-up with your pharmacy.

## 2022-04-22 MED ORDER — LEVOTHYROXINE SODIUM 150 MCG PO TABS
150.0000 ug | ORAL_TABLET | Freq: Every day | ORAL | 3 refills | Status: DC
Start: 2022-04-22 — End: 2023-05-08

## 2022-04-22 NOTE — Telephone Encounter (Signed)
Requested medication (s) are due for refill today: expired medication  Requested medication (s) are on the active medication list: yes  Last refill:  03/02/21 #90 3 refills  Future visit scheduled: yes in 3 weeks with Dr. Roxan Hockey  Notes to clinic:  expired medication. Up coming appt scheduled with Dr. Roxan Hockey transition of care. Last ordered by Dr. Sullivan Lone . Do you want to renew Rx ? Patient requesting refill.     Requested Prescriptions  Pending Prescriptions Disp Refills   levothyroxine (SYNTHROID) 150 MCG tablet 90 tablet 3    Sig: Take 1 tablet (150 mcg total) by mouth daily.     Endocrinology:  Hypothyroid Agents Failed - 04/22/2022  8:39 AM      Failed - TSH in normal range and within 360 days    TSH  Date Value Ref Range Status  01/04/2021 1.640 0.450 - 4.500 uIU/mL Final         Passed - Valid encounter within last 12 months    Recent Outpatient Visits           1 month ago Subacute cough   [facility] Malva Limes, MD   10 months ago COVID-19   [facility], Darl Householder, DO   1 year ago Pure hypercholesterolemia   [facility] Maple Hudson., MD   1 year ago Annual physical exam   [facility] Maple Hudson., MD   1 year ago Bronchitis   [facility] Malva Limes, MD       Future Appointments             In 3 weeks Simmons-Robinson, Tawanna Cooler, MD [facility]

## 2022-04-22 NOTE — Telephone Encounter (Signed)
Called patient [phone] not in service. Called [phone] to schedule appt for medication refills and transition of care to Dr. Roxan Hockey. No answer LVMTCB

## 2022-05-13 DIAGNOSIS — I493 Ventricular premature depolarization: Secondary | ICD-10-CM | POA: Diagnosis not present

## 2022-05-13 DIAGNOSIS — R0600 Dyspnea, unspecified: Secondary | ICD-10-CM | POA: Diagnosis not present

## 2022-05-13 DIAGNOSIS — B974 Respiratory syncytial virus as the cause of diseases classified elsewhere: Secondary | ICD-10-CM | POA: Diagnosis not present

## 2022-05-13 DIAGNOSIS — I4510 Unspecified right bundle-branch block: Secondary | ICD-10-CM | POA: Diagnosis not present

## 2022-05-13 DIAGNOSIS — R002 Palpitations: Secondary | ICD-10-CM | POA: Diagnosis not present

## 2022-05-13 DIAGNOSIS — Z79890 Hormone replacement therapy (postmenopausal): Secondary | ICD-10-CM | POA: Diagnosis not present

## 2022-05-13 DIAGNOSIS — Z20822 Contact with and (suspected) exposure to covid-19: Secondary | ICD-10-CM | POA: Diagnosis not present

## 2022-05-13 DIAGNOSIS — R42 Dizziness and giddiness: Secondary | ICD-10-CM | POA: Diagnosis not present

## 2022-05-13 DIAGNOSIS — J101 Influenza due to other identified influenza virus with other respiratory manifestations: Secondary | ICD-10-CM | POA: Diagnosis not present

## 2022-05-13 DIAGNOSIS — R9431 Abnormal electrocardiogram [ECG] [EKG]: Secondary | ICD-10-CM | POA: Diagnosis not present

## 2022-05-13 DIAGNOSIS — R0602 Shortness of breath: Secondary | ICD-10-CM | POA: Diagnosis not present

## 2022-05-13 DIAGNOSIS — E039 Hypothyroidism, unspecified: Secondary | ICD-10-CM | POA: Diagnosis not present

## 2022-05-18 ENCOUNTER — Ambulatory Visit: Payer: BC Managed Care – PPO | Admitting: Family Medicine

## 2022-05-18 ENCOUNTER — Ambulatory Visit: Payer: BC Managed Care – PPO | Attending: Family Medicine

## 2022-05-18 ENCOUNTER — Telehealth (INDEPENDENT_AMBULATORY_CARE_PROVIDER_SITE_OTHER): Payer: BC Managed Care – PPO | Admitting: Family Medicine

## 2022-05-18 ENCOUNTER — Encounter: Payer: Self-pay | Admitting: Family Medicine

## 2022-05-18 DIAGNOSIS — R001 Bradycardia, unspecified: Secondary | ICD-10-CM | POA: Diagnosis not present

## 2022-05-18 DIAGNOSIS — R531 Weakness: Secondary | ICD-10-CM | POA: Diagnosis not present

## 2022-05-18 DIAGNOSIS — R0602 Shortness of breath: Secondary | ICD-10-CM

## 2022-05-18 DIAGNOSIS — R002 Palpitations: Secondary | ICD-10-CM | POA: Insufficient documentation

## 2022-05-18 DIAGNOSIS — K219 Gastro-esophageal reflux disease without esophagitis: Secondary | ICD-10-CM

## 2022-05-18 MED ORDER — PANTOPRAZOLE SODIUM 40 MG PO TBEC: 40.0000 mg | DELAYED_RELEASE_TABLET | Freq: Every day | ORAL | 1 refills | Status: DC

## 2022-05-18 NOTE — Progress Notes (Deleted)
      Established patient visit   Patient: Sean Dean   DOB: 1981/04/11   41 y.o. Male  MRN: [id number] Visit Date: 05/18/2022  Today's healthcare provider: Ronnald Ramp, MD   No chief complaint on file.  Subjective    HPI   Lightheadedness SOB with Exertion, ED Follow Up  Patient was evaluated in the ED   Medications: Outpatient Medications Prior to Visit  Medication Sig   fluticasone (FLONASE) 50 MCG/ACT nasal spray Place 2 sprays into both nostrils daily.   levothyroxine (SYNTHROID) 150 MCG tablet Take 1 tablet (150 mcg total) by mouth daily.   LORazepam (ATIVAN) 0.5 MG tablet Take 1 tablet (0.5 mg total) by mouth at bedtime as needed for sleep. (Patient not taking: Reported on 03/09/2022)   No facility-administered medications prior to visit.    Review of Systems  {Labs  Heme  Chem  Endocrine  Serology  Results Review (optional):23779}   Objective    There were no vitals taken for this visit. {Show previous vital signs (optional):23777}  Physical Exam  ***  No results found for any visits on 05/18/22.  Assessment & Plan     Problem List Items Addressed This Visit   None '  No follow-ups on file.      I, Ronnald Ramp, MD, have reviewed all documentation for this visit.  Portions of this information were initially documented by the CMA and reviewed by me for thoroughness and accuracy.      Ronnald Ramp, MD  [facility] [phone] (phone) [phone] (fax)  [facility]

## 2022-05-18 NOTE — Progress Notes (Signed)
MyChart Video Visit    Virtual Visit via Video Note   This format is felt to be most appropriate for this patient at this time. Physical exam was limited by quality of the video and audio technology used for the visit.   Patient location: Patient's home address Provider location:  [facility] [address]  [place], [place] [address]   I discussed the limitations of evaluation and management by telemedicine and the availability of in person appointments. The patient expressed understanding and agreed to proceed.  Patient: Sean Dean   DOB: [birth year]   40 y.o. Male  MRN: [id number] Visit Date: 05/18/2022  Today's healthcare provider: Ronnald Ramp, MD   No chief complaint on file.  Subjective    HPI  Patient presents via video visit due to recent diagnosis of influenza and RSV.  Patient and his wife are present for visit.  Patient currently lives in [place].  Patient presents after 1 month of symptoms including shortness of breath, weakness, fatigue, palpitations. He was evaluated in the ED where he was found to have persistent bradycardia.  Patient reports that illness symptoms started after a trip to [place] after which she returned in October 2023.  He reports that he felt sick again and was treated with a Z-Pak which helped his symptoms some but then symptoms returned.  He reports being diagnosed with influenza 5 days ago.  Patient reports that he exercises while walking with a 50 pound weight 2-3 miles per day.  He has been doing this exercise regimen for the last 2 years and attempts to improve his weight management.  Patient states that he did not participate in sports prior to starting this exercise regimen. Patient's wife notes that he has a grandfather who passed away from a MI after age 46, and another grandparent who had pancreatic cancer.  Patient has both parents living, age 26 and are healthy.     Medications: Outpatient Medications Prior to Visit  Medication Sig  . fluticasone (FLONASE) 50 MCG/ACT nasal spray Place 2 sprays into both nostrils daily.  Marland Kitchen levothyroxine (SYNTHROID) 150 MCG tablet Take 1 tablet (150 mcg total) by mouth daily.  Marland Kitchen LORazepam (ATIVAN) 0.5 MG tablet Take 1 tablet (0.5 mg total) by mouth at bedtime as needed for sleep. (Patient not taking: Reported on 03/09/2022)   No facility-administered medications prior to visit.    Review of Systems  {Labs  Heme  Chem  Endocrine  Serology  Results Review (optional):23779}   Objective    There were no vitals taken for this visit.  {Show previous vital signs (optional):23777}   Physical Exam Constitutional:      General: He is not in acute distress.    Appearance: He is ill-appearing. He is not toxic-appearing.  Pulmonary:     Effort: Pulmonary effort is normal.     Comments: Patient is speaking in complete sentences Neurological:     Mental Status: He is alert and oriented to person, place, and time.       Assessment & Plan     Problem List Items Addressed This Visit       Digestive   Gastroesophageal reflux disease   Relevant Medications   pantoprazole (PROTONIX) 40 MG tablet     Other   Heart palpitations - Primary   Relevant Orders   Ambulatory referral to Cardiology   LONG TERM MONITOR (3-14 DAYS)   SOB (shortness of breath)   Relevant Orders  Ambulatory referral to Cardiology   Other Visit Diagnoses     Bradycardia       Relevant Orders   Ambulatory referral to Cardiology   LONG TERM MONITOR (3-14 DAYS)   Weakness       Relevant Orders   Ambulatory referral to Cardiology        No follow-ups on file.     I discussed the assessment and treatment plan with the patient. The patient was provided an opportunity to ask questions and all were answered. The patient agreed with the plan and demonstrated an understanding of the instructions.   The patient was advised to  call back or seek an in-person evaluation if the symptoms worsen or if the condition fails to improve as anticipated.  I provided *** minutes of non-face-to-face time during this encounter.  {provider attestation***:1}  Eulis Foster, MD [facility] [phone] (phone) [phone] (fax)  [place]

## 2022-05-19 NOTE — Assessment & Plan Note (Signed)
Refills provided for protonix 40mg  daily

## 2022-05-19 NOTE — Assessment & Plan Note (Signed)
Unclear etiology for constellation of symptoms considered myocarditis, pericarditis, structural heart disease or underlying arrhythmia  Over 1 month of symptoms including SOB, fatigue and abnormal heart rate sensations  Reviewed lab work from recent ED vision without signs of electrolyte abnormalities nor elevated BNP or abnormal troponin levels  Recommended ZioPatch for 2 weeks period followed by cardiology evaluation  Cardiology referral submitted

## 2022-05-25 DIAGNOSIS — R002 Palpitations: Secondary | ICD-10-CM

## 2022-05-25 DIAGNOSIS — R001 Bradycardia, unspecified: Secondary | ICD-10-CM

## 2022-06-01 NOTE — Progress Notes (Unsigned)
Cardiology Office Note:    Date:  06/02/2022   ID:  Sean Dean, DOB April 29, 1981, MRN [id number]  PCP:  Ronnald Ramp, MD  Cardiologist:  None  Electrophysiologist:  None   Referring MD: Maple Hudson.,*   Chief Complaint  Patient presents with   New Patient (Initial Visit)   Headache   Shortness of Breath    History of Present Illness:    Sean Dean is a 42 y.o. male with a hx of hypothyroidism who is referred by Dr. Neita Garnet for evaluation of palpitations and shortness of breath.  He reports since Thanksgiving 2023 he has been having episodes of chest discomfort, dyspnea, lightheadedness, and palpitations.  Was happening every day but now about every 3 days.  Also found to have flu and RSV in December 2023 but symptoms occurred both before and after this.  He describes chest discomfort as pressure secondary to left upper chest.  No correlation with exertion but does feel like occurs when he is stressed.  Also feels short of breath and lightheaded during episodes.  He denies any syncope.  Reports feeling palpitations recent episodes.  He is currently wearing a Zio patch.  Denies any lower extremity edema.  Reports he smoked socially for 3 years.  No family history of heart disease.   Past Medical History:  Diagnosis Date   GERD (gastroesophageal reflux disease)     Past Surgical History:  Procedure Laterality Date   UPPER GI ENDOSCOPY  06/18/02   normal everything    Current Medications: Current Meds  Medication Sig   levothyroxine (SYNTHROID) 150 MCG tablet Take 1 tablet (150 mcg total) by mouth daily.     Allergies:   Patient has no known allergies.   Social History   Socioeconomic History   Marital status: Married    Spouse name: Not on file   Number of children: Not on file   Years of education: Not on file   Highest education level: Not on file  Occupational History   Not on file  Tobacco Use   Smoking status: Former    Smokeless tobacco: Never  Substance and Sexual Activity   Alcohol use: Yes    Alcohol/week: 30.0 standard drinks of alcohol    Types: 30 Cans of beer per week    Comment: 3-4 drinks per day   Drug use: No   Sexual activity: Yes    Birth control/protection: None  Other Topics Concern   Not on file  Social History Narrative   Not on file   Social Determinants of Health   Financial Resource Strain: Not on file  Food Insecurity: Not on file  Transportation Needs: Not on file  Physical Activity: Not on file  Stress: Not on file  Social Connections: Not on file     Family History: The patient's family history includes Colon cancer in his maternal grandfather; Epilepsy in his sister; Healthy in his father and mother; Prostate cancer in his paternal grandfather.  ROS:   Please see the history of present illness.     All other systems reviewed and are negative.  EKGs/Labs/Other Studies Reviewed:    The following studies were reviewed today:   EKG:   06/02/2022: Sinus bradycardia, rate 58, no ST abnormality  Recent Labs: No results found for requested labs within last 365 days.  Recent Lipid Panel    Component Value Date/Time   CHOL 229 (H) 01/04/2021 0851   TRIG 127 01/04/2021 0851   HDL  59 01/04/2021 0851   CHOLHDL 3.9 01/04/2021 0851   LDLCALC 147 (H) 01/04/2021 0851    Physical Exam:    VS:  BP 130/74 (BP Location: Right Arm, Patient Position: Sitting, Cuff Size: Normal)   Pulse (!) 55   Ht 6\' 4"  (1.93 m)   Wt 225 lb (102.1 kg)   BMI 27.39 kg/m     Wt Readings from Last 3 Encounters:  06/02/22 225 lb (102.1 kg)  06/09/21 229 lb (103.9 kg)  09/14/20 230 lb (104.3 kg)     GEN:  Well nourished, well developed in no acute distress HEENT: Normal NECK: No JVD; No carotid bruits LYMPHATICS: No lymphadenopathy CARDIAC: RRR, no murmurs, rubs, gallops RESPIRATORY:  Clear to auscultation without rales, wheezing or rhonchi  ABDOMEN: Soft, non-tender,  non-distended MUSCULOSKELETAL:  No edema; No deformity  SKIN: Warm and dry NEUROLOGIC:  Alert and oriented x 3 PSYCHIATRIC:  Normal affect   ASSESSMENT:    1. Chest pain of uncertain etiology   2. Dyspnea, unspecified type   3. Palpitations   4. Hyperlipidemia, unspecified hyperlipidemia type    PLAN:    Chest pain/dyspnea: Atypical in description, however does report chest pain occurs when he is stressed, suggesting possible angina.  CAD risk factors include hyperlipidemia.  Recommend coronary CTA to rule out obstructive CAD.  No beta-blocker prior to study given low resting heart rate.  Check echocardiogram to rule out structural heart disease  Palpitations: Description concerning for arrhythmia.  He is currently wearing ZIO patch, will follow-up results  Hyperlipidemia: LDL 147 on 01/04/2021.  Follow-up results of coronary CTA to guide aggressive to be in lowering cholesterol  RTC in 3 months   Medication Adjustments/Labs and Tests Ordered: Current medicines are reviewed at length with the patient today.  Concerns regarding medicines are outlined above.  Orders Placed This Encounter  Procedures   CT CORONARY MORPH W/CTA COR W/SCORE W/CA W/CM &/OR WO/CM   Basic metabolic panel   Lipid panel   EKG 12-Lead   ECHOCARDIOGRAM COMPLETE   No orders of the defined types were placed in this encounter.   Patient Instructions  Medication Instructions:  Your physician recommends that you continue on your current medications as directed. Please refer to the Current Medication list given to you today.  *If you need a refill on your cardiac medications before your next appointment, please call your pharmacy*  Testing/Procedures: Coronary CTA-see instructions below  Your physician has requested that you have an echocardiogram. Echocardiography is a painless test that uses sound waves to create images of your heart. It provides your doctor with information about the size and shape of  your heart and how well your heart's chambers and valves are working. This procedure takes approximately one hour. There are no restrictions for this procedure. Please do NOT wear cologne, perfume, aftershave, or lotions (deodorant is allowed). Please arrive 15 minutes prior to your appointment time.  Follow-Up: At [facility], you and your health needs are our priority.  As part of our continuing mission to provide you with exceptional heart care, we have created designated Provider Care Teams.  These Care Teams include your primary Cardiologist (physician) and Advanced Practice Providers (APPs -  Physician Assistants and Nurse Practitioners) who all work together to provide you with the care you need, when you need it.  We recommend signing up for the patient portal called "MyChart".  Sign up information is provided on this After Visit Summary.  MyChart is used to  connect with patients for Virtual Visits (Telemedicine).  Patients are able to view lab/test results, encounter notes, upcoming appointments, etc.  Non-urgent messages can be sent to your provider as well.   To learn more about what you can do with MyChart, go to [email or web address].    Your next appointment:   3 month(s)  Provider:   Dr. Gardiner Rhyme  Other Instructions   Your cardiac CT will be scheduled at one of the below locations:   [facility] [address] [place], [place] [address] [phone]   If scheduled at [facility], please arrive at the [facility] and Children's Entrance (Entrance C2) of [facility] 30 minutes prior to test start time. You can use the FREE valet parking offered at entrance C (encouraged to control the heart rate for the test)  Proceed to the [facility] Radiology Department (first floor) to check-in and test prep.  All radiology patients and guests should use entrance C2 at [facility], accessed from [facility], even though the  hospital's physical address listed is [address].     Please follow these instructions carefully (unless otherwise directed):  Hold all erectile dysfunction medications at least 3 days (72 hrs) prior to test. (Ie viagra, cialis, sildenafil, tadalafil, etc) We will administer nitroglycerin during this exam.   On the Night Before the Test: Be sure to Drink plenty of water. Do not consume any caffeinated/decaffeinated beverages or chocolate 12 hours prior to your test. Do not take any antihistamines 12 hours prior to your test.  On the Day of the Test: Drink plenty of water until 1 hour prior to the test. Do not eat any food 1 hour prior to test. You may take your regular medications prior to the test.   After the Test: Drink plenty of water. After receiving IV contrast, you may experience a mild flushed feeling. This is normal. On occasion, you may experience a mild rash up to 24 hours after the test. This is not dangerous. If this occurs, you can take Benadryl 25 mg and increase your fluid intake. If you experience trouble breathing, this can be serious. If it is severe call 911 IMMEDIATELY. If it is mild, please call our office. If you take any of these medications: Glipizide/Metformin, Avandament, Glucavance, please do not take 48 hours after completing test unless otherwise instructed.  We will call to schedule your test 2-4 weeks out understanding that some insurance companies will need an authorization prior to the service being performed.   For non-scheduling related questions, please contact the cardiac imaging nurse navigator should you have any questions/concerns: Marchia Bond, Cardiac Imaging Nurse Navigator Gordy Clement, Cardiac Imaging Nurse Navigator [facility] Direct Office Dial: [phone]   For scheduling needs, including cancellations and rescheduling, please call [place], [phone].     Signed, Donato Heinz, MD  06/02/2022 2:01 PM    [facility]

## 2022-06-02 ENCOUNTER — Ambulatory Visit: Payer: BC Managed Care – PPO | Attending: Cardiology | Admitting: Cardiology

## 2022-06-02 ENCOUNTER — Encounter: Payer: Self-pay | Admitting: Cardiology

## 2022-06-02 VITALS — BP 130/74 | HR 55 | Ht 76.0 in | Wt 225.0 lb

## 2022-06-02 DIAGNOSIS — R002 Palpitations: Secondary | ICD-10-CM | POA: Diagnosis not present

## 2022-06-02 DIAGNOSIS — R079 Chest pain, unspecified: Secondary | ICD-10-CM | POA: Diagnosis not present

## 2022-06-02 DIAGNOSIS — E785 Hyperlipidemia, unspecified: Secondary | ICD-10-CM

## 2022-06-02 DIAGNOSIS — R0600 Dyspnea, unspecified: Secondary | ICD-10-CM

## 2022-06-02 DIAGNOSIS — Z13220 Encounter for screening for lipoid disorders: Secondary | ICD-10-CM | POA: Diagnosis not present

## 2022-06-02 NOTE — Patient Instructions (Signed)
Medication Instructions:  Your physician recommends that you continue on your current medications as directed. Please refer to the Current Medication list given to you today.  *If you need a refill on your cardiac medications before your next appointment, please call your pharmacy*  Testing/Procedures: Coronary CTA-see instructions below  Your physician has requested that you have an echocardiogram. Echocardiography is a painless test that uses sound waves to create images of your heart. It provides your doctor with information about the size and shape of your heart and how well your heart's chambers and valves are working. This procedure takes approximately one hour. There are no restrictions for this procedure. Please do NOT wear cologne, perfume, aftershave, or lotions (deodorant is allowed). Please arrive 15 minutes prior to your appointment time.  Follow-Up: At [facility], you and your health needs are our priority.  As part of our continuing mission to provide you with exceptional heart care, we have created designated Provider Care Teams.  These Care Teams include your primary Cardiologist (physician) and Advanced Practice Providers (APPs -  Physician Assistants and Nurse Practitioners) who all work together to provide you with the care you need, when you need it.  We recommend signing up for the patient portal called "MyChart".  Sign up information is provided on this After Visit Summary.  MyChart is used to connect with patients for Virtual Visits (Telemedicine).  Patients are able to view lab/test results, encounter notes, upcoming appointments, etc.  Non-urgent messages can be sent to your provider as well.   To learn more about what you can do with MyChart, go to [email or web address].    Your next appointment:   3 month(s)  Provider:   Dr. Gardiner Rhyme  Other Instructions   Your cardiac CT will be scheduled at one of the below locations:   [facility] [address] [place], [place] [address] [phone]   If scheduled at [facility], please arrive at the [facility] and Children's Entrance (Entrance C2) of [facility] 30 minutes prior to test start time. You can use the FREE valet parking offered at entrance C (encouraged to control the heart rate for the test)  Proceed to the [facility] Radiology Department (first floor) to check-in and test prep.  All radiology patients and guests should use entrance C2 at [facility], accessed from [facility], even though the hospital's physical address listed is [address].     Please follow these instructions carefully (unless otherwise directed):  Hold all erectile dysfunction medications at least 3 days (72 hrs) prior to test. (Ie viagra, cialis, sildenafil, tadalafil, etc) We will administer nitroglycerin during this exam.   On the Night Before the Test: Be sure to Drink plenty of water. Do not consume any caffeinated/decaffeinated beverages or chocolate 12 hours prior to your test. Do not take any antihistamines 12 hours prior to your test.  On the Day of the Test: Drink plenty of water until 1 hour prior to the test. Do not eat any food 1 hour prior to test. You may take your regular medications prior to the test.   After the Test: Drink plenty of water. After receiving IV contrast, you may experience a mild flushed feeling. This is normal. On occasion, you may experience a mild rash up to 24 hours after the test. This is not dangerous. If this occurs, you can take Benadryl 25 mg and increase your fluid intake. If you experience trouble breathing,  this can be serious. If it is severe call 911 IMMEDIATELY. If it is mild, please call our office. If you take any of these medications: Glipizide/Metformin, Avandament, Glucavance, please do not take 48 hours after completing test unless otherwise instructed.  We will call to schedule your test  2-4 weeks out understanding that some insurance companies will need an authorization prior to the service being performed.   For non-scheduling related questions, please contact the cardiac imaging nurse navigator should you have any questions/concerns: Marchia Bond, Cardiac Imaging Nurse Navigator Gordy Clement, Cardiac Imaging Nurse Navigator [facility] Direct Office Dial: [phone]   For scheduling needs, including cancellations and rescheduling, please call [place], [phone].

## 2022-06-03 LAB — LIPID PANEL
Chol/HDL Ratio: 3.9 ratio (ref 0.0–5.0)
Cholesterol, Total: 231 mg/dL — ABNORMAL HIGH (ref 100–199)
HDL: 59 mg/dL (ref >39)
LDL Chol Calc (NIH): 142 mg/dL — ABNORMAL HIGH (ref 0–99)
Triglycerides: 169 mg/dL — ABNORMAL HIGH (ref 0–149)
VLDL Cholesterol Cal: 30 mg/dL (ref 5–40)

## 2022-06-03 LAB — BASIC METABOLIC PANEL
BUN/Creatinine Ratio: 19 (ref 9–20)
BUN: 16 mg/dL (ref 6–24)
CO2: 21 mmol/L (ref 20–29)
Calcium: 10 mg/dL (ref 8.7–10.2)
Chloride: 101 mmol/L (ref 96–106)
Creatinine, Ser: 0.83 mg/dL (ref 0.76–1.27)
Glucose: 82 mg/dL (ref 70–99)
Potassium: 4.2 mmol/L (ref 3.5–5.2)
Sodium: 139 mmol/L (ref 134–144)
eGFR: 112 mL/min/{1.73_m2} (ref >59)

## 2022-06-09 ENCOUNTER — Telehealth (HOSPITAL_COMMUNITY): Payer: Self-pay | Admitting: Emergency Medicine

## 2022-06-09 NOTE — Telephone Encounter (Signed)
Reaching out to patient to offer assistance regarding upcoming cardiac imaging study; pt verbalizes understanding of appt date/time, parking situation and where to check in, pre-test NPO status and medications ordered, and verified current allergies; name and call back number provided for further questions should they arise Elois Averitt RN Navigator Cardiac Imaging [facility] [phone] office [phone] cell   Arrival [address] entrance  Daily meds Denies iv issues Aware contrast/nitro 

## 2022-06-10 ENCOUNTER — Ambulatory Visit (HOSPITAL_COMMUNITY)
Admission: RE | Admit: 2022-06-10 | Discharge: 2022-06-10 | Disposition: A | Discharge status: Home / Self Care | Payer: BC Managed Care – PPO | Source: Ambulatory Visit | Attending: Cardiology | Admitting: Cardiology

## 2022-06-10 DIAGNOSIS — R079 Chest pain, unspecified: Secondary | ICD-10-CM

## 2022-06-10 MED ORDER — NITROGLYCERIN 0.4 MG SL SUBL
SUBLINGUAL_TABLET | SUBLINGUAL | Status: AC
  Filled 2022-06-10: qty 2

## 2022-06-10 MED ORDER — IOHEXOL 350 MG/ML SOLN
100.0000 mL | Freq: Once | INTRAVENOUS | Status: AC | PRN
  Administered 2022-06-10: 100 mL via INTRAVENOUS

## 2022-06-10 MED ORDER — NITROGLYCERIN 0.4 MG SL SUBL
0.8000 mg | SUBLINGUAL_TABLET | Freq: Once | SUBLINGUAL | Status: AC
  Administered 2022-06-10: 0.8 mg via SUBLINGUAL

## 2022-06-20 DIAGNOSIS — R001 Bradycardia, unspecified: Secondary | ICD-10-CM | POA: Diagnosis not present

## 2022-06-20 DIAGNOSIS — R002 Palpitations: Secondary | ICD-10-CM | POA: Diagnosis not present

## 2022-06-30 ENCOUNTER — Ambulatory Visit (HOSPITAL_COMMUNITY): Payer: BC Managed Care – PPO | Attending: Cardiovascular Disease

## 2022-06-30 DIAGNOSIS — R079 Chest pain, unspecified: Secondary | ICD-10-CM | POA: Insufficient documentation

## 2022-06-30 LAB — ECHOCARDIOGRAM COMPLETE
Area-P 1/2: 2 cm2
S' Lateral: 3.5 cm

## 2022-06-30 MED ORDER — PERFLUTREN LIPID MICROSPHERE
3.0000 mL | INTRAVENOUS | Status: AC | PRN
  Administered 2022-06-30: 3 mL via INTRAVENOUS

## 2022-09-07 NOTE — Progress Notes (Unsigned)
Cardiology Office Note:    Date:  09/08/2022   ID:  Sean Dean, DOB 23-Feb-1981, MRN [id number]  PCP:  Ronnald Ramp, MD  Cardiologist:  None  Electrophysiologist:  None   Referring MD: Brett Albino*   Chief Complaint  Patient presents with   Palpitations    History of Present Illness:    Sean Dean is a 42 y.o. male with a hx of hypothyroidism who presents for follow-up.  He was referred by Dr. Neita Garnet for evaluation of palpitations and shortness of breath, initially seen on 06/02/2022.  He reports since Thanksgiving 2023 he has been having episodes of chest discomfort, dyspnea, lightheadedness, and palpitations.  Was happening every day but now about every 3 days.  Also found to have flu and RSV in December 2023 but symptoms occurred both before and after this.  He describes chest discomfort as pressure secondary to left upper chest.  No correlation with exertion but does feel like occurs when he is stressed.  Also feels short of breath and lightheaded during episodes.  He denies any syncope.  Reports feeling palpitations recent episodes.  He is currently wearing a Zio patch.  Denies any lower extremity edema.  Reports he smoked socially for 3 years.  No family history of heart disease.  Coronary CTA on 06/10/2022 showed normal coronary arteries, calcium score 0.  Echocardiogram on 06/30/2022 showed normal biventricular function, no significant valvular disease.  Zio patch x 14 days 05/2022 showed 1 episode of NSVT lasting 13 beats.  Since last clinic visit, he reports that he is doing well.  Had episode of palpitations for few days last week after dental procedure.  Otherwise denies any recent palpitations.  Denies any chest pain, dyspnea, lightheadedness, syncope, lower extremity edema.  Walking 3 miles per day.       Past Medical History:  Diagnosis Date   GERD (gastroesophageal reflux disease)     Past Surgical History:  Procedure  Laterality Date   UPPER GI ENDOSCOPY  06/18/02   normal everything    Current Medications: Current Meds  Medication Sig   Cholecalciferol 25 MCG (1000 UT) CHEW Chew 125 tablets by mouth daily.   levothyroxine (SYNTHROID) 150 MCG tablet Take 1 tablet (150 mcg total) by mouth daily.     Allergies:   Patient has no known allergies.   Social History   Socioeconomic History   Marital status: Married    Spouse name: Not on file   Number of children: Not on file   Years of education: Not on file   Highest education level: Not on file  Occupational History   Not on file  Tobacco Use   Smoking status: Former   Smokeless tobacco: Never  Substance and Sexual Activity   Alcohol use: Yes    Alcohol/week: 30.0 standard drinks of alcohol    Types: 30 Cans of beer per week    Comment: 3-4 drinks per day   Drug use: No   Sexual activity: Yes    Birth control/protection: None  Other Topics Concern   Not on file  Social History Narrative   Not on file   Social Determinants of Health   Financial Resource Strain: Not on file  Food Insecurity: Not on file  Transportation Needs: Not on file  Physical Activity: Not on file  Stress: Not on file  Social Connections: Not on file     Family History: The patient's family history includes Colon cancer in his maternal grandfather; Epilepsy in  his sister; Healthy in his father and mother; Prostate cancer in his paternal grandfather.  ROS:   Please see the history of present illness.     All other systems reviewed and are negative.  EKGs/Labs/Other Studies Reviewed:    The following studies were reviewed today:   EKG:   06/02/2022: Sinus bradycardia, rate 58, no ST abnormality  Recent Labs: 06/02/2022: BUN 16; Creatinine, Ser 0.83; Potassium 4.2; Sodium 139  Recent Lipid Panel    Component Value Date/Time   CHOL 231 (H) 06/02/2022 1416   TRIG 169 (H) 06/02/2022 1416   HDL 59 06/02/2022 1416   CHOLHDL 3.9 06/02/2022 1416    LDLCALC 142 (H) 06/02/2022 1416    Physical Exam:    VS:  BP (!) 140/78 (BP Location: Left Arm, Patient Position: Sitting, Cuff Size: Large)   Pulse 91   Ht  (1.93 m)   Wt 229 lb 6.4 oz (104.1 kg)   SpO2 98%   BMI 27.92 kg/m     Wt Readings from Last 3 Encounters:  09/08/22 229 lb 6.4 oz (104.1 kg)  06/02/22 225 lb (102.1 kg)  06/09/21 229 lb (103.9 kg)     GEN:  Well nourished, well developed in no acute distress HEENT: Normal NECK: No JVD; No carotid bruits LYMPHATICS: No lymphadenopathy CARDIAC: RRR, no murmurs, rubs, gallops RESPIRATORY:  Clear to auscultation without rales, wheezing or rhonchi  ABDOMEN: Soft, non-tender, non-distended MUSCULOSKELETAL:  No edema; No deformity  SKIN: Warm and dry NEUROLOGIC:  Alert and oriented x 3 PSYCHIATRIC:  Normal affect   ASSESSMENT:    1. Palpitations   2. Hyperlipidemia, unspecified hyperlipidemia type   3. Elevated blood pressure reading without diagnosis of hypertension     PLAN:    Chest pain/dyspnea: Atypical in description.  Coronary CTA on 06/10/2022 showed normal coronary arteries, calcium score 0.  Echocardiogram on 06/30/2022 showed normal biventricular function, no significant valvular disease.    Palpitations: Zio patch x 14 days 05/2022 showed 1 episode of NSVT lasting 13 beats. -Reports palpitations have improved.  Recommend Kardia mobile device for longer-term monitoring  Hyperlipidemia: LDL 147 on 01/04/2021.  Calcium score 0 as above.  Diet/exercise recommended  Elevated BP: BP elevated in clinic today, no history of hypertension.  Recommend checking BP twice daily for next week and calling with results  RTC in 1 year   Medication Adjustments/Labs and Tests Ordered: Current medicines are reviewed at length with the patient today.  Concerns regarding medicines are outlined above.  No orders of the defined types were placed in this encounter.  No orders of the defined types were placed in this  encounter.   Patient Instructions  Medication Instructions:  Your physician recommends that you continue on your current medications as directed. Please refer to the Current Medication list given to you today.  *If you need a refill on your cardiac medications before your next appointment, please call your pharmacy*  Follow-Up: At [facility], you and your health needs are our priority.  As part of our continuing mission to provide you with exceptional heart care, we have created designated Provider Care Teams.  These Care Teams include your primary Cardiologist (physician) and Advanced Practice Providers (APPs -  Physician Assistants and Nurse Practitioners) who all work together to provide you with the care you need, when you need it.  We recommend signing up for the patient portal called "MyChart".  Sign up information is provided on this After Visit Summary.  MyChart is used to connect with patients for Virtual Visits (Telemedicine).  Patients are able to view lab/test results, encounter notes, upcoming appointments, etc.  Non-urgent messages can be sent to your provider as well.   To learn more about what you can do with MyChart, go to [email or web address].    Your next appointment:   12 month(s)  Provider:   Dr. Bjorn Pippin  Other Instructions Recommend Omron upper arm blood pressure cuff Please check your blood pressure at home twice daily, write it down.  Call the office or send message via Mychart with the readings in 1 week for Dr. Bjorn Pippin to review.    You can look into the [facility] device by [employer]. This device is purchased by you and it connects to an application you download to your smart phone.  It can detect abnormal heart rhythms and alert you to contact your doctor for further evaluation. The web site is:  [email or web address].[email or web address]      Signed, Little Ishikawa, MD  09/08/2022 1:47 PM    [facility]

## 2022-09-08 ENCOUNTER — Encounter: Payer: Self-pay | Admitting: Cardiology

## 2022-09-08 ENCOUNTER — Ambulatory Visit: Payer: BC Managed Care – PPO | Attending: Cardiology | Admitting: Cardiology

## 2022-09-08 VITALS — BP 140/78 | HR 91 | Ht 76.0 in | Wt 229.4 lb

## 2022-09-08 DIAGNOSIS — R002 Palpitations: Secondary | ICD-10-CM

## 2022-09-08 DIAGNOSIS — E785 Hyperlipidemia, unspecified: Secondary | ICD-10-CM | POA: Diagnosis not present

## 2022-09-08 DIAGNOSIS — R030 Elevated blood-pressure reading, without diagnosis of hypertension: Secondary | ICD-10-CM

## 2022-09-08 NOTE — Patient Instructions (Signed)
Medication Instructions:  Your physician recommends that you continue on your current medications as directed. Please refer to the Current Medication list given to you today.  *If you need a refill on your cardiac medications before your next appointment, please call your pharmacy*  Follow-Up: At [facility], you and your health needs are our priority.  As part of our continuing mission to provide you with exceptional heart care, we have created designated Provider Care Teams.  These Care Teams include your primary Cardiologist (physician) and Advanced Practice Providers (APPs -  Physician Assistants and Nurse Practitioners) who all work together to provide you with the care you need, when you need it.  We recommend signing up for the patient portal called "MyChart".  Sign up information is provided on this After Visit Summary.  MyChart is used to connect with patients for Virtual Visits (Telemedicine).  Patients are able to view lab/test results, encounter notes, upcoming appointments, etc.  Non-urgent messages can be sent to your provider as well.   To learn more about what you can do with MyChart, go to [email or web address].    Your next appointment:   12 month(s)  Provider:   Dr. Bjorn Pippin  Other Instructions Recommend Omron upper arm blood pressure cuff Please check your blood pressure at home twice daily, write it down.  Call the office or send message via Mychart with the readings in 1 week for Dr. Bjorn Pippin to review.    You can look into the [facility] device by [employer]. This device is purchased by you and it connects to an application you download to your smart phone.  It can detect abnormal heart rhythms and alert you to contact your doctor for further evaluation. The web site is:  [email or web address]://www.alivecor.com

## 2023-05-05 ENCOUNTER — Ambulatory Visit (INDEPENDENT_AMBULATORY_CARE_PROVIDER_SITE_OTHER): Payer: PRIVATE HEALTH INSURANCE | Admitting: Family Medicine

## 2023-05-05 VITALS — BP 148/95 | HR 52 | Temp 98.1°F | Resp 16 | Ht 76.0 in | Wt 231.7 lb

## 2023-05-05 DIAGNOSIS — E7849 Other hyperlipidemia: Secondary | ICD-10-CM | POA: Diagnosis not present

## 2023-05-05 DIAGNOSIS — F419 Anxiety disorder, unspecified: Secondary | ICD-10-CM

## 2023-05-05 DIAGNOSIS — E039 Hypothyroidism, unspecified: Secondary | ICD-10-CM | POA: Diagnosis not present

## 2023-05-05 DIAGNOSIS — I10 Essential (primary) hypertension: Secondary | ICD-10-CM | POA: Diagnosis not present

## 2023-05-05 MED ORDER — AMLODIPINE BESYLATE 5 MG PO TABS: 5.0000 mg | ORAL_TABLET | Freq: Every day | ORAL | 1 refills | Status: DC

## 2023-05-05 MED ORDER — BUSPIRONE HCL 7.5 MG PO TABS: 7.5000 mg | ORAL_TABLET | Freq: Two times a day (BID) | ORAL | 1 refills | Status: DC

## 2023-05-05 NOTE — Assessment & Plan Note (Signed)
Chronic  Reviewed The 10-year ASCVD risk score (Arnett DK, et al., 2019) is: 2.5% Recommended diet and exercise to manage levels  No recommendation for statin therapy at this time

## 2023-05-05 NOTE — Patient Instructions (Addendum)
VISIT SUMMARY:  Today, we discussed your recent health concerns, including elevated blood pressure, increased stress and anxiety, changes in alcohol consumption, and general health maintenance. We reviewed your symptoms and made a plan to address each issue.  YOUR PLAN:  -HYPERTENSION: Hypertension means high blood pressure. Your blood pressure today was 148/95, which is higher than normal. We believe stress and anxiety may be contributing to this. We have prescribed amlodipine to help lower your blood pressure. Please take this medication as directed and recheck your blood pressure in two weeks. We will also run some blood tests to check your overall health.  -ANXIETY AND DEPRESSION: Anxiety and depression are mental health conditions that can cause symptoms like palpitations, excessive worry, and changes in sleep and appetite. Your scores indicate moderate anxiety and depression. We have prescribed buspirone to help manage your anxiety. Please take this medication twice a day as directed. We also recommend seeing a therapist and avoiding alcohol to help manage your symptoms.  -ALCOHOL USE: You have reported an increase in alcohol consumption due to stress, which can affect your anxiety and blood pressure. We advise you to reduce your alcohol intake and monitor how it impacts your health.  -GENERAL HEALTH MAINTENANCE: Your LDL cholesterol was elevated at 142 in January, but your calcium score was zero, indicating low cardiovascular risk. Continue following a healthy diet and exercise regimen as recommended by your cardiologist. We will monitor your cholesterol levels regularly.  INSTRUCTIONS:  Please follow up with cardiology in one year. Schedule a follow-up appointment with [place] in two weeks to recheck your blood pressure and review your lab results.

## 2023-05-05 NOTE — Assessment & Plan Note (Signed)
Elevated blood pressure at 148/95 today, previously 140/78. No prior hypertension history. Stress and anxiety may be contributing. Discussed treatment options; amlodipine chosen for minimal impact on kidney function and potassium levels.   - Prescribe amlodipine   - Recheck blood pressure in two weeks   - Order complete metabolic panel, CBC, thyroid studies, and cholesterol levels

## 2023-05-05 NOTE — Progress Notes (Signed)
Established patient visit   Patient: Sean Dean   DOB: 12-01-1980   42 y.o. Male  MRN: [id number] Visit Date: 05/05/2023  Today's healthcare provider: Ronnald Ramp, MD   Chief Complaint  Patient presents with   Anxiety    Life events affecting anixety, work stress and home life   Subjective     HPI     Anxiety    Additional comments: Life events affecting anixety, work stress and home life      Last edited by Clois Comber on 05/05/2023 10:10 AM.       Discussed the use of AI scribe software for clinical note transcription with the patient, who gave verbal consent to proceed.  History of Present Illness   The patient is a 42 year old male with a history of palpitations and elevated blood pressure. He was evaluated by cardiology in April for palpitations and was recommended for continued monitoring of his heart rhythm. His calcium score was zero and LDL was 142 in January. He was advised to follow a diet and exercise regimen. His blood pressure was noted to be elevated in clinic with no history of hypertension.  The patient reports frequent palpitations, particularly during periods of anxiety. He describes a recent increase in stress due to professional changes and family issues. He has quit his job abruptly and is currently unemployed, with a new job starting in January. He also reports his parents are on the brink of a breakup.  The patient has been trying to manage his stress by exercising daily, but otherwise reports a lack of activity. He has noticed changes in his behavior, such as a decrease in his usual cleanliness habits at home. He also reports an increase in alcohol consumption, from two to four drinks daily, since the onset of his increased stress.  The patient's blood pressure remains elevated at 148/95. He also reports variable sleep patterns, with periods of insomnia when he was employed, and excessive sleep since becoming unemployed. He  also reports changes in appetite, with instances of overeating.  The patient has a history of thyroid issues and admits to not taking his medication consistently. He reports sometimes missing a day once a week and not taking it at the recommended time in the morning. He also expresses concern about his cholesterol levels, which were previously noted to be high, but he was not recommended to take a statin due to a lack of plaque in his examinations.         Past Medical History:  Diagnosis Date   GERD (gastroesophageal reflux disease)     Medications: Outpatient Medications Prior to Visit  Medication Sig   levothyroxine (SYNTHROID) 150 MCG tablet Take 1 tablet (150 mcg total) by mouth daily.   [DISCONTINUED] Cholecalciferol 25 MCG (1000 UT) CHEW Chew 125 tablets by mouth daily. (Patient not taking: Reported on 05/05/2023)   No facility-administered medications prior to visit.   Flowsheet Row Office Visit from 05/05/2023 in [facility]  PHQ-9 Total Score 14         05/05/2023   10:18 AM  GAD 7 : Generalized Anxiety Score  Nervous, Anxious, on Edge 2  Control/stop worrying 2  Worry too much - different things 2  Trouble relaxing 2  Restless 0  Easily annoyed or irritable 1  Afraid - awful might happen 2  Total GAD 7 Score 11  Anxiety Difficulty Very difficult     Review of Systems  Last CBC Lab Results  Component Value Date   WBC 10.5 09/14/2020   HGB 15.3 09/14/2020   HCT 45.1 09/14/2020   MCV 90 09/14/2020   MCH 30.7 09/14/2020   RDW 12.3 09/14/2020   PLT 233 09/14/2020   Last metabolic panel Lab Results  Component Value Date   GLUCOSE 82 06/02/2022   NA 139 06/02/2022   K 4.2 06/02/2022   CL 101 06/02/2022   CO2 21 06/02/2022   BUN 16 06/02/2022   CREATININE 0.83 06/02/2022   EGFR 112 06/02/2022   CALCIUM 10.0 06/02/2022   PROT 8.0 09/14/2020   ALBUMIN 5.2 (H) 09/14/2020   LABGLOB 2.8 09/14/2020   AGRATIO 1.9 09/14/2020    BILITOT 0.6 09/14/2020   ALKPHOS 101 09/14/2020   AST 15 09/14/2020   ALT 19 09/14/2020   ANIONGAP 8 05/29/2012   Last lipids Lab Results  Component Value Date   CHOL 231 (H) 06/02/2022   HDL 59 06/02/2022   LDLCALC 142 (H) 06/02/2022   TRIG 169 (H) 06/02/2022   CHOLHDL 3.9 06/02/2022    The 16-XWRU ASCVD risk score (Arnett DK, et al., 2019) is: 2.5%  Last hemoglobin A1c No results found for: "HGBA1C"   Last thyroid functions Lab Results  Component Value Date   TSH 1.640 01/04/2021        Objective    BP (!) 148/95 (BP Location: Right Arm, Patient Position: Sitting, Cuff Size: Normal)   Pulse (!) 52   Temp 98.1 F (36.7 C)   Resp 16   Ht 6\' 4"  (1.93 m)   Wt 231 lb 11.2 oz (105.1 kg)   SpO2 100%   BMI 28.20 kg/m    BP Readings from Last 3 Encounters:  05/05/23 (!) 148/95  09/08/22 (!) 140/78  06/10/22 130/71   Wt Readings from Last 3 Encounters:  05/05/23 231 lb 11.2 oz (105.1 kg)  09/08/22 229 lb 6.4 oz (104.1 kg)  06/02/22 225 lb (102.1 kg)        Physical Exam Vitals reviewed.  Constitutional:      General: He is not in acute distress.    Appearance: Normal appearance. He is not ill-appearing, toxic-appearing or diaphoretic.  Eyes:     Conjunctiva/sclera: Conjunctivae normal.  Cardiovascular:     Rate and Rhythm: Normal rate and regular rhythm.     Pulses: Normal pulses.     Heart sounds: Normal heart sounds. No murmur heard.    No friction rub. No gallop.  Pulmonary:     Effort: Pulmonary effort is normal. No respiratory distress.     Breath sounds: Normal breath sounds. No stridor. No wheezing, rhonchi or rales.  Abdominal:     General: Bowel sounds are normal. There is no distension.     Palpations: Abdomen is soft.     Tenderness: There is no abdominal tenderness.  Musculoskeletal:     Right lower leg: No edema.     Left lower leg: No edema.  Skin:    Findings: No erythema or rash.  Neurological:     Mental Status: He is alert  and oriented to person, place, and time.  Psychiatric:        Attention and Perception: Attention normal.        Mood and Affect: Mood is anxious.        Speech: Speech normal.        Behavior: Behavior normal. Behavior is cooperative.        Thought Content: Thought content  does not include suicidal ideation. Thought content does not include suicidal plan.       No results found for any visits on 05/05/23.  Assessment & Plan     Problem List Items Addressed This Visit       Cardiovascular and Mediastinum   Primary hypertension - Primary   Elevated blood pressure at 148/95 today, previously 140/78. No prior hypertension history. Stress and anxiety may be contributing. Discussed treatment options; amlodipine chosen for minimal impact on kidney function and potassium levels.   - Prescribe amlodipine   - Recheck blood pressure in two weeks   - Order complete metabolic panel, CBC, thyroid studies, and cholesterol levels        Relevant Medications   amLODipine (NORVASC) 5 MG tablet   Other Relevant Orders   CMP14+EGFR   CBC     Endocrine   Hypothyroidism   Chronic  Measure TSH/T4 and T3  Continue synthroid daily  Encouraged regular adherence to synthroid prescription       Relevant Orders   TSH+T4F+T3Free     Other   Hyperlipidemia   Chronic  Reviewed The 10-year ASCVD risk score (Arnett DK, et al., 2019) is: 2.5% Recommended diet and exercise to manage levels  No recommendation for statin therapy at this time        Relevant Medications   amLODipine (NORVASC) 5 MG tablet   Other Relevant Orders   Lipid panel   Anxiety   Significant stress from job loss and family issues. PHQ-9 score 14, anxiety score 11. Symptoms include palpitations, excessive worry, and changes in sleep and appetite. No suicidal ideation. Discussed buspirone 7.5 mg BID; potential side effects include dizziness, nausea, and diarrhea. Advised to avoid alcohol.   - Prescribe buspirone 7.5  mg BID   - Recommend therapy referral   - Advise to avoid alcohol        Relevant Medications   busPIRone (BUSPAR) 7.5 MG tablet     Alcohol Use   Increased alcohol consumption to four drinks/day due to stress. Patient believes he can reduce intake.   - Advise to reduce alcohol consumption   - Monitor alcohol use and its impact on anxiety and blood pressure       Return in about 3 weeks (around 05/26/2023) for HTN, Anxiety.       Ronnald Ramp, MD  [facility] [phone] (phone) [phone] (fax)  [facility]

## 2023-05-05 NOTE — Assessment & Plan Note (Signed)
Significant stress from job loss and family issues. PHQ-9 score 14, anxiety score 11. Symptoms include palpitations, excessive worry, and changes in sleep and appetite. No suicidal ideation. Discussed buspirone 7.5 mg BID; potential side effects include dizziness, nausea, and diarrhea. Advised to avoid alcohol.   - Prescribe buspirone 7.5 mg BID   - Recommend therapy referral   - Advise to avoid alcohol

## 2023-05-05 NOTE — Assessment & Plan Note (Signed)
Chronic  Measure TSH/T4 and T3  Continue synthroid daily  Encouraged regular adherence to synthroid prescription

## 2023-05-06 LAB — CMP14+EGFR
ALT: 34 [IU]/L (ref 0–44)
AST: 20 [IU]/L (ref 0–40)
Albumin: 4.9 g/dL (ref 4.1–5.1)
Alkaline Phosphatase: 88 [IU]/L (ref 44–121)
BUN/Creatinine Ratio: 16 (ref 9–20)
BUN: 15 mg/dL (ref 6–24)
Bilirubin Total: 0.8 mg/dL (ref 0.0–1.2)
CO2: 23 mmol/L (ref 20–29)
Calcium: 10.4 mg/dL — ABNORMAL HIGH (ref 8.7–10.2)
Chloride: 101 mmol/L (ref 96–106)
Creatinine, Ser: 0.95 mg/dL (ref 0.76–1.27)
Globulin, Total: 2.4 g/dL (ref 1.5–4.5)
Glucose: 89 mg/dL (ref 70–99)
Potassium: 4.5 mmol/L (ref 3.5–5.2)
Sodium: 140 mmol/L (ref 134–144)
Total Protein: 7.3 g/dL (ref 6.0–8.5)
eGFR: 102 mL/min/{1.73_m2} (ref >59)

## 2023-05-06 LAB — LIPID PANEL
Chol/HDL Ratio: 4 {ratio} (ref 0.0–5.0)
Cholesterol, Total: 268 mg/dL — ABNORMAL HIGH (ref 100–199)
HDL: 67 mg/dL (ref >39)
LDL Chol Calc (NIH): 176 mg/dL — ABNORMAL HIGH (ref 0–99)
Triglycerides: 141 mg/dL (ref 0–149)
VLDL Cholesterol Cal: 25 mg/dL (ref 5–40)

## 2023-05-06 LAB — CBC
Hematocrit: 45.2 % (ref 37.5–51.0)
Hemoglobin: 15 g/dL (ref 13.0–17.7)
MCH: 30.6 pg (ref 26.6–33.0)
MCHC: 33.2 g/dL (ref 31.5–35.7)
MCV: 92 fL (ref 79–97)
Platelets: 205 10*3/uL (ref 150–450)
RBC: 4.9 x10E6/uL (ref 4.14–5.80)
RDW: 12.2 % (ref 11.6–15.4)
WBC: 6.7 10*3/uL (ref 3.4–10.8)

## 2023-05-06 LAB — TSH+T4F+T3FREE
Free T4: 1.74 ng/dL (ref 0.82–1.77)
T3, Free: 2.8 pg/mL (ref 2.0–4.4)
TSH: 4.68 u[IU]/mL — ABNORMAL HIGH (ref 0.450–4.500)

## 2023-05-08 ENCOUNTER — Other Ambulatory Visit: Payer: Self-pay | Admitting: Family Medicine

## 2023-05-08 DIAGNOSIS — E039 Hypothyroidism, unspecified: Secondary | ICD-10-CM

## 2023-05-30 ENCOUNTER — Other Ambulatory Visit: Payer: Self-pay | Admitting: Family Medicine

## 2023-05-30 DIAGNOSIS — F419 Anxiety disorder, unspecified: Secondary | ICD-10-CM

## 2023-06-26 ENCOUNTER — Ambulatory Visit: Payer: PRIVATE HEALTH INSURANCE | Admitting: Family Medicine

## 2023-07-03 ENCOUNTER — Ambulatory Visit: Payer: Self-pay | Admitting: Family Medicine

## 2023-07-03 ENCOUNTER — Encounter: Payer: Self-pay | Admitting: Family Medicine

## 2023-07-03 VITALS — BP 134/65 | HR 50 | Ht 76.0 in | Wt 229.0 lb

## 2023-07-03 DIAGNOSIS — F419 Anxiety disorder, unspecified: Secondary | ICD-10-CM | POA: Diagnosis not present

## 2023-07-03 DIAGNOSIS — E039 Hypothyroidism, unspecified: Secondary | ICD-10-CM | POA: Diagnosis not present

## 2023-07-03 DIAGNOSIS — E7849 Other hyperlipidemia: Secondary | ICD-10-CM

## 2023-07-03 DIAGNOSIS — I10 Essential (primary) hypertension: Secondary | ICD-10-CM

## 2023-07-03 NOTE — Assessment & Plan Note (Signed)
 Managed with Synthroid  150 mcg daily. Rechecking thyroid  function tests today due to previous slight elevation in TSH levels. Chronic, improved - Continue Synthroid  150 mcg daily - Order TSH and calcium levels

## 2023-07-03 NOTE — Progress Notes (Signed)
 Established patient visit   Patient: Sean Dean   DOB: 1980-09-09   43 y.o. Male  MRN: [id number] Visit Date: 07/03/2023  Today's healthcare provider: Mimi Alt, MD   Chief Complaint  Patient presents with   Medical Management of Chronic Issues   Depression    Wants to decrease buspar  dosage   Subjective     HPI     Depression    Additional comments: Wants to decrease buspar  dosage      Last edited by Marnie Siren, CMA on 07/03/2023  2:23 PM.       Discussed the use of AI scribe software for clinical note transcription with the patient, who gave verbal consent to proceed.  History of Present Illness   Sean Dean is a 43 year old male who presents for follow-up.  His blood pressure is well controlled with amlodipine  5 mg daily, and he experiences no lightheadedness or dizziness.  He continues to take Synthroid  150 mcg daily for hypothyroidism.  For anxiety, he is on buspirone  7.5 mg twice daily and feels a lot better with no heart palpitations. He wants to decrease the dose but acknowledges the current regimen is effective.  He has a history of alcohol use and has abstained for a month, consuming non-alcoholic beers. He relapsed a few times but never exceeds two beers in one sitting or six beers in a week, maintaining self-discipline in controlling his intake.  No heart palpitations, lightheadedness, or dizziness.         Past Medical History:  Diagnosis Date   GERD (gastroesophageal reflux disease)     Medications: Outpatient Medications Prior to Visit  Medication Sig   amLODipine  (NORVASC ) 5 MG tablet Take 1 tablet (5 mg total) by mouth daily.   busPIRone  (BUSPAR ) 7.5 MG tablet TAKE 1 TABLET BY MOUTH 2 TIMES DAILY.   levothyroxine  (SYNTHROID ) 150 MCG tablet TAKE 1 TABLET BY MOUTH EVERY DAY   No facility-administered medications prior to visit.    Review of Systems  Last metabolic panel Lab Results  Component  Value Date   GLUCOSE 89 05/05/2023   NA 140 05/05/2023   K 4.5 05/05/2023   CL 101 05/05/2023   CO2 23 05/05/2023   BUN 15 05/05/2023   CREATININE 0.95 05/05/2023   EGFR 102 05/05/2023   CALCIUM 10.4 (H) 05/05/2023   PROT 7.3 05/05/2023   ALBUMIN 4.9 05/05/2023   LABGLOB 2.4 05/05/2023   AGRATIO 1.9 09/14/2020   BILITOT 0.8 05/05/2023   ALKPHOS 88 05/05/2023   AST 20 05/05/2023   ALT 34 05/05/2023   ANIONGAP 8 05/29/2012   Last lipids Lab Results  Component Value Date   CHOL 268 (H) 05/05/2023   HDL 67 05/05/2023   LDLCALC 176 (H) 05/05/2023   TRIG 141 05/05/2023   CHOLHDL 4.0 05/05/2023   Last hemoglobin A1c No results found for: "HGBA1C" Last thyroid  functions Lab Results  Component Value Date   TSH 4.680 (H) 05/05/2023        Objective    BP 134/65   Pulse (!) 50   Ht 6\' 4"  (1.93 m)   Wt 229 lb (103.9 kg)   BMI 27.87 kg/m  BP Readings from Last 3 Encounters:  07/03/23 134/65  05/05/23 (!) 148/95  09/08/22 (!) 140/78   Wt Readings from Last 3 Encounters:  07/03/23 229 lb (103.9 kg)  05/05/23 231 lb 11.2 oz (105.1 kg)  09/08/22 229 lb 6.4 oz (104.1 kg)  Physical Exam  Physical Exam   VITALS: BP- 134/65 CARDIOVASCULAR: Regular rate and rhythm.     PSYCH: normal mood and affect,  nor SI/HI  No results found for any visits on 07/03/23.  Assessment & Plan     Problem List Items Addressed This Visit       Cardiovascular and Mediastinum   Primary hypertension - Primary   Hypertension is well controlled with a current blood pressure of 134/65 mmHg on amlodipine  5 mg daily. No symptoms of lightheadedness or dizziness reported. Chronic, improved and within goal range  - Continue amlodipine  5 mg daily      Relevant Orders   CMP14+EGFR     Endocrine   Hypothyroidism   Managed with Synthroid  150 mcg daily. Rechecking thyroid  function tests today due to previous slight elevation in TSH levels. Chronic, improved - Continue Synthroid  150  mcg daily - Order TSH and calcium levels      Relevant Orders   TSH+T4F+T3Free     Other   Hyperlipidemia   Managed with diet and lifestyle modifications due to a low AACVD score of 2.5%. - Continue diet and lifestyle modifications            Alcohol Use Significantly reduced alcohol consumption, with occasional relapses but within recommended limits (=14 drinks/week, =4 drinks/sitting). Aware of potential interactions with buspirone  and counseled to avoid excessive alcohol use. - Counsel on risks of alcohol use with buspirone   General Health Maintenance Due for physical examination and routine screenings. - Schedule physical examination in July - Perform fasting cholesterol and diabetes screening during physical     Return in about 5 months (around 11/30/2023) for CPE.         Mimi Alt, MD  [facility] [phone] (phone) [phone] (fax)  [facility]

## 2023-07-03 NOTE — Assessment & Plan Note (Signed)
 Hypertension is well controlled with a current blood pressure of 134/65 mmHg on amlodipine  5 mg daily. No symptoms of lightheadedness or dizziness reported. Chronic, improved and within goal range  - Continue amlodipine  5 mg daily

## 2023-07-03 NOTE — Assessment & Plan Note (Signed)
 Managed with diet and lifestyle modifications due to a low AACVD score of 2.5%. - Continue diet and lifestyle modifications

## 2023-07-03 NOTE — Assessment & Plan Note (Signed)
 Managed with buspirone  7.5 mg twice daily. Significant improvement in symptoms, including no episodes of heart palpitations and feeling calm. Discussed dose reduction but agreed to continue current regimen. Reviewed dosing range (5 mg to 30 mg, up to three times daily). Chronic - Continue buspirone  7.5 mg twice daily

## 2023-07-04 LAB — TSH+T4F+T3FREE
Free T4: 2.13 ng/dL — ABNORMAL HIGH (ref 0.82–1.77)
T3, Free: 3.3 pg/mL (ref 2.0–4.4)
TSH: 0.464 u[IU]/mL (ref 0.450–4.500)

## 2023-07-04 LAB — CMP14+EGFR
ALT: 26 [IU]/L (ref 0–44)
AST: 21 [IU]/L (ref 0–40)
Albumin: 4.7 g/dL (ref 4.1–5.1)
Alkaline Phosphatase: 91 [IU]/L (ref 44–121)
BUN/Creatinine Ratio: 15 (ref 9–20)
BUN: 13 mg/dL (ref 6–24)
Bilirubin Total: 0.4 mg/dL (ref 0.0–1.2)
CO2: 24 mmol/L (ref 20–29)
Calcium: 10.3 mg/dL — ABNORMAL HIGH (ref 8.7–10.2)
Chloride: 102 mmol/L (ref 96–106)
Creatinine, Ser: 0.89 mg/dL (ref 0.76–1.27)
Globulin, Total: 2.6 g/dL (ref 1.5–4.5)
Glucose: 86 mg/dL (ref 70–99)
Potassium: 4.1 mmol/L (ref 3.5–5.2)
Sodium: 141 mmol/L (ref 134–144)
Total Protein: 7.3 g/dL (ref 6.0–8.5)
eGFR: 109 mL/min/{1.73_m2} (ref >59)

## 2023-07-05 ENCOUNTER — Encounter: Payer: Self-pay | Admitting: Family Medicine

## 2023-09-18 ENCOUNTER — Other Ambulatory Visit: Payer: Self-pay | Admitting: Family Medicine

## 2023-09-18 DIAGNOSIS — F419 Anxiety disorder, unspecified: Secondary | ICD-10-CM

## 2023-09-21 NOTE — Telephone Encounter (Signed)
 Change in pharmacy location- will forward Requested Prescriptions  Pending Prescriptions Disp Refills   busPIRone  (BUSPAR ) 7.5 MG tablet [Pharmacy Med Name: BUSPIRONE  HCL 7.5 MG TABLET] 180 tablet 1    Sig: TAKE 1 TABLET BY MOUTH TWICE A DAY     Psychiatry: Anxiolytics/Hypnotics - Non-controlled Passed - 09/21/2023  8:19 AM      Passed - Valid encounter within last 12 months    Recent Outpatient Visits           2 months ago Primary hypertension   [place] [facility] Simmons-Robinson, Judyann Number, MD   11 years ago Fever   [facility] [place], Skipper Dumas, MD       Future Appointments             In 2 months Simmons-Robinson, Judyann Number, MD [facility]

## 2023-11-05 ENCOUNTER — Other Ambulatory Visit: Payer: Self-pay | Admitting: Family Medicine

## 2023-11-05 DIAGNOSIS — I10 Essential (primary) hypertension: Secondary | ICD-10-CM

## 2023-11-25 ENCOUNTER — Other Ambulatory Visit: Payer: Self-pay | Admitting: Family Medicine

## 2023-11-25 DIAGNOSIS — E039 Hypothyroidism, unspecified: Secondary | ICD-10-CM

## 2023-11-30 ENCOUNTER — Encounter: Payer: BC Managed Care – PPO | Admitting: Family Medicine

## 2024-01-10 ENCOUNTER — Encounter: Payer: Self-pay | Admitting: Family Medicine

## 2024-01-10 ENCOUNTER — Ambulatory Visit (INDEPENDENT_AMBULATORY_CARE_PROVIDER_SITE_OTHER): Admitting: Family Medicine

## 2024-01-10 VITALS — BP 115/72 | HR 51 | Resp 16 | Wt 223.0 lb

## 2024-01-10 DIAGNOSIS — Z0000 Encounter for general adult medical examination without abnormal findings: Secondary | ICD-10-CM | POA: Insufficient documentation

## 2024-01-10 DIAGNOSIS — Z131 Encounter for screening for diabetes mellitus: Secondary | ICD-10-CM | POA: Diagnosis not present

## 2024-01-10 DIAGNOSIS — E559 Vitamin D deficiency, unspecified: Secondary | ICD-10-CM | POA: Diagnosis not present

## 2024-01-10 DIAGNOSIS — Z6827 Body mass index (BMI) 27.0-27.9, adult: Secondary | ICD-10-CM

## 2024-01-10 DIAGNOSIS — E7800 Pure hypercholesterolemia, unspecified: Secondary | ICD-10-CM

## 2024-01-10 DIAGNOSIS — Z130 Encounter for screening for diseases of the blood and blood-forming organs and certain disorders involving the immune mechanism: Secondary | ICD-10-CM

## 2024-01-10 DIAGNOSIS — L508 Other urticaria: Secondary | ICD-10-CM | POA: Diagnosis not present

## 2024-01-10 DIAGNOSIS — E039 Hypothyroidism, unspecified: Secondary | ICD-10-CM | POA: Diagnosis not present

## 2024-01-10 DIAGNOSIS — K219 Gastro-esophageal reflux disease without esophagitis: Secondary | ICD-10-CM | POA: Diagnosis not present

## 2024-01-10 DIAGNOSIS — K279 Peptic ulcer, site unspecified, unspecified as acute or chronic, without hemorrhage or perforation: Secondary | ICD-10-CM

## 2024-01-10 DIAGNOSIS — F419 Anxiety disorder, unspecified: Secondary | ICD-10-CM

## 2024-01-10 DIAGNOSIS — I10 Essential (primary) hypertension: Secondary | ICD-10-CM

## 2024-01-10 NOTE — Assessment & Plan Note (Signed)
 Adult Wellness Visit 43 year old male presenting for an annual physical examination. Blood pressure and weight are within normal range. Heart rate is 51, indicating good cardiovascular fitness. No additional screenings recommended at this time. - Recommend 150 minutes of exercise weekly - Order vitamin D  level - Order thyroid  studies - Order lipid panel - Order A1c - Order CBC - Order CMP - Discuss vaccines: COVID, flu, hepatitis B, and HPV

## 2024-01-10 NOTE — Progress Notes (Signed)
 Complete physical exam   Patient: Sean Dean   DOB: [birth year]   43 y.o. Male  MRN: [id number] Visit Date: 01/10/2024  Today's healthcare provider: Rockie Agent, MD   Chief Complaint  Patient presents with   Annual Exam    CPE.SABRA No other concerns   Subjective    Sean Dean is a 43 y.o. male who presents today for a complete physical exam.    He does not have additional problems to discuss today.   Discussed the use of AI scribe software for clinical note transcription with the patient, who gave verbal consent to proceed.  History of Present Illness Sean Dean is a 43 year old male who presents for an annual physical exam.  He engages in regular physical activity, including walking with a 25-pound weighted vest and completing a 5K almost every other day. He is concerned about his liver health due to a history of consuming up to 14 drinks per week, with occasional instances of consuming three drinks in a day. 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  He is currently taking amlodipine  for blood pressure, Synthroid  150 mcg for hypothyroidism, and Buspar  7.5 mg for anxiety. He has a history of primary hypertension, GERD, autoimmune urticaria, hypothyroidism, gastroduodenal ulcer, vitamin D  deficiency, hypercholesterolemia, and anxiety.  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 No difficulty starting urination, no blood in urine, occasional forgetfulness.     Past Medical History:  Diagnosis Date   GERD (gastroesophageal reflux disease)    Past Surgical History:  Procedure Laterality Date   UPPER GI ENDOSCOPY   06/18/02   normal everything   Social History   Socioeconomic History   Marital status: Married    Spouse name: Not on file   Number of children: Not on file   Years of education: Not on file   Highest education level: Bachelor's degree (e.g., BA, AB, BS)  Occupational History   Not on file  Tobacco Use   Smoking status: Former   Smokeless tobacco: Never  Substance and Sexual Activity   Alcohol use: Yes    Alcohol/week: 30.0 standard drinks of alcohol    Types: 30 Cans of beer per week    Comment: 3-4 drinks per day   Drug use: No   Sexual activity: Yes    Birth control/protection: None  Other Topics Concern   Not on file  Social History Narrative   Not on file   Social Drivers of Health   Financial Resource Strain: Low Risk  (01/09/2024)   Overall Financial Resource Strain (CARDIA)    Difficulty of Paying Living Expenses: Not very hard  Food Insecurity: No Food Insecurity (01/09/2024)   Hunger Vital Sign    Worried About Running Out of Food in the Last Year: Never true    Ran Out of Food in the Last Year: Never true  Transportation Needs: No Transportation Needs (01/09/2024)   PRAPARE - Administrator, Civil Service (Medical): No    Lack of Transportation (Non-Medical): No  Physical Activity: Sufficiently Active (01/09/2024)   Exercise Vital Sign    Days of Exercise per Week: 5 days  Minutes of Exercise per Session: 70 min  Stress: Stress Concern Present (01/09/2024)   [employer] of Occupational Health - Occupational Stress Questionnaire    Feeling of Stress: To some extent  Social Connections: Socially Isolated (01/09/2024)   Social Connection and Isolation Panel    Frequency of Communication with Friends and Family: Once a week    Frequency of Social Gatherings with Friends and Family: Never    Attends Religious Services: Never    Diplomatic Services operational officer: No    Attends Engineer, structural: Not on file    Marital  Status: Married  Catering manager Violence: Not on file   Family Status  Relation Name Status   Sister  Alive   Mother  Alive   Father  Alive   MGM  Deceased   MGF  (Not Specified)   PGF  (Not Specified)  No partnership data on file   Family History  Problem Relation Age of Onset   Epilepsy Sister    Healthy Mother    Healthy Father    Colon cancer Maternal Grandfather    Prostate cancer Paternal Grandfather    No Known Allergies   Medications: Outpatient Medications Prior to Visit  Medication Sig   amLODipine  (NORVASC ) 5 MG tablet TAKE 1 TABLET (5 MG TOTAL) BY MOUTH DAILY.   busPIRone  (BUSPAR ) 7.5 MG tablet TAKE 1 TABLET BY MOUTH TWICE A DAY   levothyroxine  (SYNTHROID ) 150 MCG tablet TAKE 1 TABLET BY MOUTH EVERY DAY   No facility-administered medications prior to visit.    Review of Systems  Last CBC Lab Results  Component Value Date   WBC 6.7 05/05/2023   HGB 15.0 05/05/2023   HCT 45.2 05/05/2023   MCV 92 05/05/2023   MCH 30.6 05/05/2023   RDW 12.2 05/05/2023   PLT 205 05/05/2023   Last metabolic panel Lab Results  Component Value Date   GLUCOSE 86 07/03/2023   NA 141 07/03/2023   K 4.1 07/03/2023   CL 102 07/03/2023   CO2 24 07/03/2023   BUN 13 07/03/2023   CREATININE 0.89 07/03/2023   EGFR 109 07/03/2023   CALCIUM 10.3 (H) 07/03/2023   PROT 7.3 07/03/2023   ALBUMIN 4.7 07/03/2023   LABGLOB 2.6 07/03/2023   AGRATIO 1.9 09/14/2020   BILITOT 0.4 07/03/2023   ALKPHOS 91 07/03/2023   AST 21 07/03/2023   ALT 26 07/03/2023   ANIONGAP 8 05/29/2012   Last lipids Lab Results  Component Value Date   CHOL 268 (H) 05/05/2023   HDL 67 05/05/2023   LDLCALC 176 (H) 05/05/2023   TRIG 141 05/05/2023   CHOLHDL 4.0 05/05/2023   The 10-year ASCVD risk score (Arnett DK, et al., 2019) is: 2%  Last hemoglobin A1c No results found for: HGBA1C Last thyroid  functions Lab Results  Component Value Date   TSH 0.464 07/03/2023   Last vitamin D  No results  found for: 25OHVITD2, 25OHVITD3, VD25OH Last vitamin B12 and Folate No results found for: VITAMINB12, FOLATE     Objective    BP 115/72 (BP Location: Right Arm, Patient Position: Sitting, Cuff Size: Large)   Pulse (!) 51   Resp 16   Wt 223 lb (101.2 kg)   SpO2 100%   BMI 27.14 kg/m   BP Readings from Last 3 Encounters:  01/10/24 115/72  07/03/23 134/65  05/05/23 (!) 148/95   Wt Readings from Last 3 Encounters:  01/10/24 223 lb (101.2 kg)  07/03/23 229 lb (103.9 kg)  05/05/23 231 lb 11.2 oz (105.1 kg)        Physical Exam Constitutional:      General: He is not in acute distress.    Appearance: Normal appearance. He is not ill-appearing.  HENT:     Mouth/Throat:     Mouth: Mucous membranes are moist.  Eyes:     General: No scleral icterus.       Right eye: No discharge.        Left eye: No discharge.     Extraocular Movements: Extraocular movements intact.     Conjunctiva/sclera: Conjunctivae normal.     Pupils: Pupils are equal, round, and reactive to light.  Cardiovascular:     Rate and Rhythm: Normal rate and regular rhythm.     Heart sounds: Normal heart sounds.  Pulmonary:     Effort: Pulmonary effort is normal.     Breath sounds: Normal breath sounds.  Abdominal:     General: Bowel sounds are normal. There is no distension.     Palpations: Abdomen is soft.     Tenderness: There is no abdominal tenderness.  Musculoskeletal:        General: No deformity or signs of injury. Normal range of motion.     Cervical back: Normal range of motion and neck supple. No tenderness.     Right lower leg: No edema.     Left lower leg: No edema.  Lymphadenopathy:     Cervical: No cervical adenopathy.  Neurological:     Mental Status: He is alert and oriented to person, place, and time.     Cranial Nerves: No cranial nerve deficit.     Motor: No weakness.     Gait: Gait normal.  Psychiatric:        Mood and Affect: Mood normal.        Behavior: Behavior  normal.       Last depression screening scores    01/10/2024    9:41 AM 07/03/2023    2:24 PM 05/05/2023   10:16 AM  PHQ 2/9 Scores  PHQ - 2 Score 2 0 4  PHQ- 9 Score 8 6 14     Last fall risk screening    09/14/2020    2:29 PM  Fall Risk   Falls in the past year? 0  Number falls in past yr: 0  Injury with Fall? 0  Risk for fall due to : No Fall Risks  Follow up Falls evaluation completed      Data saved with a previous flowsheet row definition    Last Audit-C alcohol use screening    01/09/2024    9:35 PM  Alcohol Use Disorder Test (AUDIT)  1. How often do you have a drink containing alcohol? 4  2. How many drinks containing alcohol do you have on a typical day when you are drinking? 0  3. How often do you have six or more drinks on one occasion? 0  AUDIT-C Score 4   4. How often during the last year have you found that you were not able to stop drinking once you had started? 0  5. How often during the last year have you failed to do what was normally expected from you because of drinking? 0  6. How often during the last year have you needed a first drink in the morning to get yourself going after a heavy drinking session? 0  7. How often during the last year have you had a feeling of  guilt of remorse after drinking? 0  8. How often during the last year have you been unable to remember what happened the night before because you had been drinking? 0  9. Have you or someone else been injured as a result of your drinking? 0  10. Has a relative or friend or a doctor or another health worker been concerned about your drinking or suggested you cut down? 2  Alcohol Use Disorder Identification Test Final Score (AUDIT) 6      Patient-reported   A score of 3 or more in women, and 4 or more in men indicates increased risk for alcohol abuse, EXCEPT if all of the points are from question 1   No results found for any visits on 01/10/24.  Assessment & Plan    Routine Health  Maintenance and Physical Exam  Immunization History  Administered Date(s) Administered   Influenza,inj,Quad PF,6+ Mos 05/13/2015   PFIZER(Purple Top)SARS-COV-2 Vaccination 09/02/2019, 09/25/2019   Tdap 03/31/2005, 07/06/2010, 05/06/2011, 05/13/2015    Health Maintenance  Topic Date Due   Hepatitis B Vaccines 19-59 Average Risk (1 of 3 - 19+ 3-dose series) Never done   HPV VACCINES (1 - 3-dose SCDM series) Never done   COVID-19 Vaccine (3 - Pfizer risk series) 10/23/2019   INFLUENZA VACCINE  12/22/2023   Pneumococcal Vaccine (1 of 2 - PCV) 07/02/2024 (Originally 12/13/1998)   Hepatitis C Screening  07/02/2024 (Originally 12/12/1997)   HIV Screening  07/02/2024 (Originally 12/13/1994)   DTaP/Tdap/Td (5 - Td or Tdap) 05/12/2025   Meningococcal B Vaccine  Aged Out    Problem List Items Addressed This Visit       Cardiovascular and Mediastinum   Primary hypertension   Chronic, well controlled  Blood pressure is well-controlled on current medication regimen. - Continue amlodipine       Relevant Orders   CMP14+EGFR     Digestive   Gastroesophageal reflux disease   Relevant Orders   CBC   Gastroduodenal ulcer   Relevant Orders   CBC     Endocrine   Hypothyroidism   Chronic  - Continue Synthroid  150 mcg      Relevant Orders   TSH+T4F+T3Free     Musculoskeletal and Integument   Autoimmune urticaria     Other   Vitamin D  deficiency   Relevant Orders   VITAMIN D  25 Hydroxy (Vit-D Deficiency, Fractures)   Pure hypercholesterolemia   Relevant Orders   Lipid panel   Anxiety   Annual physical exam - Primary   Adult Wellness Visit 43 year old male presenting for an annual physical examination. Blood pressure and weight are within normal range. Heart rate is 51, indicating good cardiovascular fitness. No additional screenings recommended at this time. - Recommend 150 minutes of exercise weekly - Order vitamin D  level - Order thyroid  studies - Order lipid panel - Order  A1c - Order CBC - Order CMP - Discuss vaccines: COVID, flu, hepatitis B, and HPV      Other Visit Diagnoses       Screening for deficiency anemia       Relevant Orders   CBC     Screening for diabetes mellitus       Relevant Orders   Hemoglobin A1c     Body mass index (BMI) of 27.0 to 27.9 in adult       Relevant Orders   TSH+T4F+T3Free   Lipid panel   Hemoglobin A1c   CMP14+EGFR        Assessment & Plan  Hypercholesterolemia Lipid panel ordered to assess current cholesterol levels.  Vitamin D  deficiency Routine monitoring required. - Check vitamin D  level  Anxiety disorder - Continue Buspar  7.5 mg    Return in about 3 months (around 04/11/2024) for HTN, Anxiety.       Rockie Agent, MD  [facility] [phone] (phone) [phone] (fax)  [facility]

## 2024-01-10 NOTE — Patient Instructions (Signed)
 It was a pleasure to see you today!  Thank you for choosing [facility] for your primary care.   Today you were seen for your annual physical  Please review the attached information regarding helpful preventive health topics.   To keep you healthy, please keep in mind the following health maintenance items that you are due for:   Health Maintenance Due  Topic Date Due   Hepatitis B Vaccines 19-59 Average Risk (1 of 3 - 19+ 3-dose series) Never done   HPV VACCINES (1 - 3-dose SCDM series) Never done   COVID-19 Vaccine (3 - Pfizer risk series) 10/23/2019   INFLUENZA VACCINE  12/22/2023     Best Wishes,   Dr. Lang

## 2024-01-10 NOTE — Assessment & Plan Note (Signed)
 Chronic  - Continue Synthroid  150 mcg

## 2024-01-10 NOTE — Assessment & Plan Note (Signed)
 Chronic, well controlled  Blood pressure is well-controlled on current medication regimen. - Continue amlodipine 

## 2024-01-11 LAB — CMP14+EGFR
ALT: 22 IU/L (ref 0–44)
AST: 20 IU/L (ref 0–40)
Albumin: 4.7 g/dL (ref 4.1–5.1)
Alkaline Phosphatase: 73 IU/L (ref 44–121)
BUN/Creatinine Ratio: 22 — ABNORMAL HIGH (ref 9–20)
BUN: 20 mg/dL (ref 6–24)
Bilirubin Total: 0.5 mg/dL (ref 0.0–1.2)
CO2: 23 mmol/L (ref 20–29)
Calcium: 10.3 mg/dL — ABNORMAL HIGH (ref 8.7–10.2)
Chloride: 101 mmol/L (ref 96–106)
Creatinine, Ser: 0.91 mg/dL (ref 0.76–1.27)
Globulin, Total: 2.6 g/dL (ref 1.5–4.5)
Glucose: 87 mg/dL (ref 70–99)
Potassium: 5.1 mmol/L (ref 3.5–5.2)
Sodium: 139 mmol/L (ref 134–144)
Total Protein: 7.3 g/dL (ref 6.0–8.5)
eGFR: 107 mL/min/1.73 (ref >59)

## 2024-01-11 LAB — TSH+T4F+T3FREE
Free T4: 1.82 ng/dL — ABNORMAL HIGH (ref 0.82–1.77)
T3, Free: 3.1 pg/mL (ref 2.0–4.4)
TSH: 1.98 u[IU]/mL (ref 0.450–4.500)

## 2024-01-11 LAB — CBC
Hematocrit: 44.7 % (ref 37.5–51.0)
Hemoglobin: 14.9 g/dL (ref 13.0–17.7)
MCH: 30.7 pg (ref 26.6–33.0)
MCHC: 33.3 g/dL (ref 31.5–35.7)
MCV: 92 fL (ref 79–97)
Platelets: 215 x10E3/uL (ref 150–450)
RBC: 4.86 x10E6/uL (ref 4.14–5.80)
RDW: 12.1 % (ref 11.6–15.4)
WBC: 5.7 x10E3/uL (ref 3.4–10.8)

## 2024-01-11 LAB — HEMOGLOBIN A1C
Est. average glucose Bld gHb Est-mCnc: 108 mg/dL
Hgb A1c MFr Bld: 5.4 % (ref 4.8–5.6)

## 2024-01-11 LAB — LIPID PANEL
Chol/HDL Ratio: 3.7 ratio (ref 0.0–5.0)
Cholesterol, Total: 234 mg/dL — ABNORMAL HIGH (ref 100–199)
HDL: 63 mg/dL (ref >39)
LDL Chol Calc (NIH): 153 mg/dL — ABNORMAL HIGH (ref 0–99)
Triglycerides: 103 mg/dL (ref 0–149)
VLDL Cholesterol Cal: 18 mg/dL (ref 5–40)

## 2024-01-11 LAB — VITAMIN D 25 HYDROXY (VIT D DEFICIENCY, FRACTURES): Vit D, 25-Hydroxy: 36 ng/mL (ref 30.0–100.0)

## 2024-01-12 ENCOUNTER — Ambulatory Visit: Payer: Self-pay | Admitting: Family Medicine

## 2024-01-17 ENCOUNTER — Encounter: Payer: Self-pay | Admitting: Family Medicine

## 2024-03-21 ENCOUNTER — Other Ambulatory Visit: Payer: Self-pay | Admitting: Family Medicine

## 2024-03-21 DIAGNOSIS — F419 Anxiety disorder, unspecified: Secondary | ICD-10-CM

## 2024-04-15 ENCOUNTER — Ambulatory Visit: Admitting: Family Medicine

## 2024-04-15 ENCOUNTER — Encounter: Payer: Self-pay | Admitting: Family Medicine

## 2024-04-15 VITALS — BP 142/80 | HR 60 | Ht 76.0 in | Wt 230.2 lb

## 2024-04-15 DIAGNOSIS — E7849 Other hyperlipidemia: Secondary | ICD-10-CM | POA: Diagnosis not present

## 2024-04-15 DIAGNOSIS — E039 Hypothyroidism, unspecified: Secondary | ICD-10-CM

## 2024-04-15 DIAGNOSIS — E559 Vitamin D deficiency, unspecified: Secondary | ICD-10-CM

## 2024-04-15 DIAGNOSIS — Z23 Encounter for immunization: Secondary | ICD-10-CM | POA: Diagnosis not present

## 2024-04-15 DIAGNOSIS — F419 Anxiety disorder, unspecified: Secondary | ICD-10-CM

## 2024-04-15 DIAGNOSIS — I10 Essential (primary) hypertension: Secondary | ICD-10-CM | POA: Diagnosis not present

## 2024-04-15 MED ORDER — AMLODIPINE BESYLATE 10 MG PO TABS: 10.0000 mg | ORAL_TABLET | Freq: Every day | ORAL | 1 refills | Status: AC

## 2024-04-15 MED ORDER — HYDROXYZINE HCL 50 MG PO TABS: 50.0000 mg | ORAL_TABLET | Freq: Three times a day (TID) | ORAL | 2 refills | Status: AC | PRN

## 2024-04-15 NOTE — Assessment & Plan Note (Signed)
 Anxiety disorder Chronic anxiety with current treatment of buspirone  7.5 mg twice daily. Reports limited efficacy and prefers to switch to hydroxyzine  as needed, which was effective in the past. Anxiety exacerbated by travel and marital issues. - Discontinued buspirone . - Prescribed hydroxyzine  50 mg as needed, up to three times daily. - Monitor response to hydroxyzine  and adjust as necessary.

## 2024-04-15 NOTE — Progress Notes (Signed)
 Established patient visit   Patient: Sean Dean   DOB: Aug 09, 1980   43 y.o. Male  MRN: [id number] Visit Date: 04/15/2024  Today's healthcare provider: Rockie Agent, MD   Chief Complaint  Patient presents with   Medical Management of Chronic Issues    Patient presents for f/u htn, anxiety. Doing well overall, would like to discuss switching from buspar  to hydroxyzine  as needed. Took this years ago and helped well   Hypertension   Anxiety   Subjective     HPI     Medical Management of Chronic Issues    Additional comments: Patient presents for f/u htn, anxiety. Doing well overall, would like to discuss switching from buspar  to hydroxyzine  as needed. Took this years ago and helped well      Last edited by Cherry Chiquita HERO, CMA on 04/15/2024  9:36 AM.       Discussed the use of AI scribe software for clinical note transcription with the patient, who gave verbal consent to proceed.  History of Present Illness GARCIA DALZELL is a 43 year old male with chronic hypertension, anxiety, hyperlipidemia, and hypothyroidism who presents for follow-up.  His blood pressure was elevated at 148/76 during today's visit. He is currently taking amlodipine  5 mg daily for hypertension. At home, his blood pressure readings are generally around 120/80, as checked by his wife.  He is taking buspirone  7.5 mg twice daily for chronic anxiety but feels it is not effective. He wants to switch to hydroxyzine  as needed, which he has used in the past with good results. He attributes some of his anxiety to marital issues and frequent travel for work, which involves staying in hotels and being away from home for extended periods.  He continues to take Synthroid  150 mcg daily for hypothyroidism. His last thyroid  function tests showed a free T4 of 1.82 and a TSH of 1.9.  He has a history of hyperlipidemia and vitamin D  deficiency, though specific details about current management or  symptoms were not discussed in this visit.  He would like to receive his influenza vaccine, but it was unavailable at the clinic today.     Past Medical History:  Diagnosis Date   Anxiety    Depression    GERD (gastroesophageal reflux disease)    Hypertension 2024    Medications: Outpatient Medications Prior to Visit  Medication Sig Note   levothyroxine  (SYNTHROID ) 150 MCG tablet TAKE 1 TABLET BY MOUTH EVERY DAY    [DISCONTINUED] amLODipine  (NORVASC ) 5 MG tablet TAKE 1 TABLET (5 MG TOTAL) BY MOUTH DAILY.    [DISCONTINUED] busPIRone  (BUSPAR ) 7.5 MG tablet TAKE 1 TABLET BY MOUTH TWICE A DAY 04/15/2024: pt did not notice benefit   No facility-administered medications prior to visit.    Review of Systems  Last metabolic panel Lab Results  Component Value Date   GLUCOSE 87 01/10/2024   NA 139 01/10/2024   K 5.1 01/10/2024   CL 101 01/10/2024   CO2 23 01/10/2024   BUN 20 01/10/2024   CREATININE 0.91 01/10/2024   EGFR 107 01/10/2024   CALCIUM 10.3 (H) 01/10/2024   PROT 7.3 01/10/2024   ALBUMIN 4.7 01/10/2024   LABGLOB 2.6 01/10/2024   AGRATIO 1.9 09/14/2020   BILITOT 0.5 01/10/2024   ALKPHOS 73 01/10/2024   AST 20 01/10/2024   ALT 22 01/10/2024   ANIONGAP 8 05/29/2012   Last hemoglobin A1c Lab Results  Component Value Date   HGBA1C 5.4 01/10/2024  Last thyroid  functions Lab Results  Component Value Date   TSH 1.980 01/10/2024   FREET4 1.82 (H) 01/10/2024        Objective    BP (!) 142/80 (Cuff Size: Normal)   Pulse 60   Ht 6' 4 (1.93 m)   Wt 230 lb 3.2 oz (104.4 kg)   SpO2 100%   BMI 28.02 kg/m   BP Readings from Last 3 Encounters:  04/15/24 (!) 142/80  01/10/24 115/72  07/03/23 134/65   Wt Readings from Last 3 Encounters:  04/15/24 230 lb 3.2 oz (104.4 kg)  01/10/24 223 lb (101.2 kg)  07/03/23 229 lb (103.9 kg)        Physical Exam Vitals reviewed.  Constitutional:      General: He is not in acute distress.    Appearance: Normal  appearance. He is not ill-appearing.  Cardiovascular:     Rate and Rhythm: Normal rate and regular rhythm.  Pulmonary:     Effort: Pulmonary effort is normal. No respiratory distress.     Breath sounds: No wheezing, rhonchi or rales.  Neurological:     Mental Status: He is alert and oriented to person, place, and time.  Psychiatric:        Mood and Affect: Mood is anxious. Mood is not depressed. Affect is not flat, tearful or inappropriate.        Speech: Speech normal. He is communicative. Speech is not rapid and pressured.        Behavior: Behavior normal. Behavior is not agitated, slowed, aggressive or withdrawn. Behavior is cooperative.        Thought Content: Thought content normal.       No results found for any visits on 04/15/24.  Assessment & Plan     Problem List Items Addressed This Visit     Anxiety   Anxiety disorder Chronic anxiety with current treatment of buspirone  7.5 mg twice daily. Reports limited efficacy and prefers to switch to hydroxyzine  as needed, which was effective in the past. Anxiety exacerbated by travel and marital issues. - Discontinued buspirone . - Prescribed hydroxyzine  50 mg as needed, up to three times daily. - Monitor response to hydroxyzine  and adjust as necessary.      Relevant Medications   hydrOXYzine  (ATARAX ) 50 MG tablet   Hyperlipidemia   Relevant Medications   amLODipine  (NORVASC ) 10 MG tablet   Hypothyroidism   Hypothyroidism Chronic hypothyroidism managed with Synthroid  150 mcg daily. Recent thyroid  function tests show normal TSH and free T4 levels. - Continue Synthroid  150 mcg daily.      Primary hypertension - Primary   Essential hypertension Chronic  Blood pressure is elevated at 148/76 mmHg. Home readings are reportedly normal, suggesting possible white coat hypertension. Current medication is amlodipine  5 mg daily. Goal is to achieve blood pressure consistently in the 120s/70s range. - Increased amlodipine  to 10 mg  daily. - Advised monitoring for edema, especially in the shins, and to report if swelling occurs. - Advised reducing sodium intake to under 2300 mg per day. - Encouraged regular exercise and weight management.      Relevant Medications   amLODipine  (NORVASC ) 10 MG tablet   Vitamin D  deficiency   Other Visit Diagnoses       Immunization due           Assessment and Plan Assessment & Plan      Encounter for immunization Desires influenza vaccination but was informed that the clinic was out of stock. - Advised  obtaining influenza vaccine at a pharmacy.     Return in about 3 months (around 07/16/2024) for HTN, Anxiety.         Rockie Agent, MD  [facility] [phone] (phone) [phone] (fax)  [facility]

## 2024-04-15 NOTE — Assessment & Plan Note (Signed)
 Hypothyroidism Chronic hypothyroidism managed with Synthroid  150 mcg daily. Recent thyroid  function tests show normal TSH and free T4 levels. - Continue Synthroid  150 mcg daily.

## 2024-04-15 NOTE — Assessment & Plan Note (Signed)
 Essential hypertension Chronic  Blood pressure is elevated at 148/76 mmHg. Home readings are reportedly normal, suggesting possible white coat hypertension. Current medication is amlodipine  5 mg daily. Goal is to achieve blood pressure consistently in the 120s/70s range. - Increased amlodipine  to 10 mg daily. - Advised monitoring for edema, especially in the shins, and to report if swelling occurs. - Advised reducing sodium intake to under 2300 mg per day. - Encouraged regular exercise and weight management.

## 2024-04-15 NOTE — Patient Instructions (Signed)
 To keep you healthy, please keep in mind the following health maintenance items that you are due for:   Health Maintenance Due  Topic Date Due   Hepatitis B Vaccines 19-59 Average Risk (1 of 3 - 19+ 3-dose series) Never done   Influenza Vaccine  12/22/2023     Best Wishes,   Dr. Lang

## 2024-05-07 ENCOUNTER — Encounter: Payer: Self-pay | Admitting: Family Medicine

## 2024-05-10 ENCOUNTER — Other Ambulatory Visit: Payer: Self-pay | Admitting: Family Medicine

## 2024-05-27 ENCOUNTER — Other Ambulatory Visit: Payer: Self-pay | Admitting: Family Medicine

## 2024-05-27 DIAGNOSIS — E039 Hypothyroidism, unspecified: Secondary | ICD-10-CM

## 2024-07-17 ENCOUNTER — Ambulatory Visit: Admitting: Family Medicine
